# Patient Record
Sex: Female | Born: 1980 | Race: White | Hispanic: No | Marital: Single | Smoking: Never smoker
Health system: Southern US, Community
[De-identification: ages and names within clinical notes are randomized; demographics above are authoritative.]

## PROBLEM LIST (undated history)

## (undated) DIAGNOSIS — M359 Systemic involvement of connective tissue, unspecified: Secondary | ICD-10-CM

## (undated) DIAGNOSIS — F99 Mental disorder, not otherwise specified: Secondary | ICD-10-CM

## (undated) HISTORY — DX: Systemic involvement of connective tissue, unspecified: M35.9

## (undated) HISTORY — PX: OTHER SURGICAL HISTORY: SHX169

## (undated) HISTORY — DX: Mental disorder, not otherwise specified: F99

---

## 2015-10-07 ENCOUNTER — Encounter: Payer: Self-pay | Admitting: Family Medicine

## 2015-10-07 ENCOUNTER — Ambulatory Visit (INDEPENDENT_AMBULATORY_CARE_PROVIDER_SITE_OTHER): Payer: BC Managed Care – PPO | Admitting: Family Medicine

## 2015-10-07 ENCOUNTER — Ambulatory Visit (HOSPITAL_BASED_OUTPATIENT_CLINIC_OR_DEPARTMENT_OTHER)
Admission: RE | Admit: 2015-10-07 | Discharge: 2015-10-07 | Disposition: A | Discharge status: Home / Self Care | Payer: BC Managed Care – PPO | Source: Ambulatory Visit | Attending: Family Medicine | Admitting: Family Medicine

## 2015-10-07 VITALS — BP 117/71 | HR 75 | Ht 67.0 in | Wt 120.0 lb

## 2015-10-07 DIAGNOSIS — X58XXXA Exposure to other specified factors, initial encounter: Secondary | ICD-10-CM | POA: Diagnosis not present

## 2015-10-07 DIAGNOSIS — M25552 Pain in left hip: Secondary | ICD-10-CM

## 2015-10-07 DIAGNOSIS — M84359A Stress fracture, hip, unspecified, initial encounter for fracture: Secondary | ICD-10-CM | POA: Diagnosis not present

## 2015-10-07 DIAGNOSIS — M8430XA Stress fracture, unspecified site, initial encounter for fracture: Secondary | ICD-10-CM

## 2015-10-08 ENCOUNTER — Other Ambulatory Visit: Payer: Self-pay | Admitting: Family Medicine

## 2015-10-08 DIAGNOSIS — M25552 Pain in left hip: Secondary | ICD-10-CM

## 2015-10-10 ENCOUNTER — Telehealth: Payer: Self-pay | Admitting: Family Medicine

## 2015-10-10 DIAGNOSIS — M25552 Pain in left hip: Secondary | ICD-10-CM | POA: Insufficient documentation

## 2015-10-10 NOTE — Telephone Encounter (Signed)
The other way to go because her exam is consistent with a stress fracture is to not run for 6 weeks, reevaluate her in the office at that time.  It's not ideal because as we discussed in the office some of these stress fractures require her to not bear weight for several weeks but you don't know that for sure unless you have the MRI.  So I'd recommend she not run for 6 weeks, follow up with us at that time.

## 2015-10-10 NOTE — Assessment & Plan Note (Signed)
concerning for stress fracture.  Independently reviewed radiographs - negative for fracture or other bony abnormalities.  Will go ahead with MRI to assess for stress fracture.  No running in the meantime.

## 2015-10-10 NOTE — Progress Notes (Signed)
PCP: No primary care provider on file.  Subjective:   HPI: Patient is a 34 y.o. female here for left hip pain.  Patient reports she ran a half marathon 10 days ago. She has had some left hip pain dating back 4 weeks though it's been worse since the marathon. At most ran 27 miles per week leading up to the half. No acute injury or trauma. Pain worse with running. No history of stress fracture. Pain level 0/10 at rest but up to 7/10 with running, sharp anteriorly. No skin changes, fever, bruising, swelling.  No past medical history on file.  No current outpatient prescriptions on file prior to visit.   No current facility-administered medications on file prior to visit.    No past surgical history on file.  No Known Allergies  Social History   Social History  . Marital Status: Single    Spouse Name: N/A  . Number of Children: N/A  . Years of Education: N/A   Occupational History  . Not on file.   Social History Main Topics  . Smoking status: Never Smoker   . Smokeless tobacco: Not on file  . Alcohol Use: Not on file  . Drug Use: Not on file  . Sexual Activity: Not on file   Other Topics Concern  . Not on file   Social History Narrative  . No narrative on file    No family history on file.  BP 117/71 mmHg  Pulse 75  Ht 5\' 7"  (1.702 m)  Wt 120 lb (54.432 kg)  BMI 18.79 kg/m2  Review of Systems: See HPI above.    Objective:  Physical Exam:  Gen: NAD  Back: No gross deformity, scoliosis. No paraspinal TTP.  No midline or bony TTP. FROM without pain. Strength LEs 5/5 all muscle groups.   2+ MSRs in patellar and achilles tendons, equal bilaterally. Negative SLRs. Sensation intact to light touch bilaterally.  Left hip: FROM with negative logroll. Positive hop test. Negative fulcrum. Negative fabers, piriformis.    Assessment & Plan:  1. Left hip pain - concerning for stress fracture.  Independently reviewed radiographs - negative for fracture  or other bony abnormalities.  Will go ahead with MRI to assess for stress fracture.  No running in the meantime.

## 2015-10-12 ENCOUNTER — Ambulatory Visit (HOSPITAL_BASED_OUTPATIENT_CLINIC_OR_DEPARTMENT_OTHER): Payer: BC Managed Care – PPO

## 2015-10-24 ENCOUNTER — Other Ambulatory Visit (HOSPITAL_COMMUNITY): Payer: BC Managed Care – PPO

## 2015-11-18 ENCOUNTER — Encounter: Payer: Self-pay | Admitting: Family Medicine

## 2015-11-18 ENCOUNTER — Ambulatory Visit (INDEPENDENT_AMBULATORY_CARE_PROVIDER_SITE_OTHER): Payer: BC Managed Care – PPO | Admitting: Family Medicine

## 2015-11-18 VITALS — BP 109/78 | HR 73 | Ht 67.0 in | Wt 125.0 lb

## 2015-11-18 DIAGNOSIS — M25552 Pain in left hip: Secondary | ICD-10-CM

## 2015-11-18 NOTE — Patient Instructions (Addendum)
Follow up with me in 6 weeks at the latest. Don't run until I see you back. Call me in 2-4 weeks if you aren't improving and want to go ahead with an MRI. X-rays are another option - if you see healing it can be helpful.

## 2015-11-19 NOTE — Progress Notes (Signed)
PCP: Dwana Melena, MD  Subjective:   HPI: Patient is a 35 y.o. female here for left hip pain.  10/07/15: Patient reports she ran a half marathon 10 days ago. She has had some left hip pain dating back 4 weeks though it's been worse since the marathon. At most ran 27 miles per week leading up to the half. No acute injury or trauma. Pain worse with running. No history of stress fracture. Pain level 0/10 at rest but up to 7/10 with running, sharp anteriorly. No skin changes, fever, bruising, swelling.  11/18/15: Patient reports she has improved mildly since last visit. Pain level 0/10 at rest, up to 5/10 with a lot of walking. Used crutches for 4 weeks. No running or exercise for 6 weeks. No skin changes, fever, other complaints.  No past medical history on file.  Current Outpatient Prescriptions on File Prior to Visit  Medication Sig Dispense Refill  . busPIRone (BUSPAR) 15 MG tablet Take 15 mg by mouth 2 (two) times daily.  0  . traZODone (DESYREL) 50 MG tablet Take by mouth at bedtime as needed.  0   No current facility-administered medications on file prior to visit.    No past surgical history on file.  No Known Allergies  Social History   Social History  . Marital Status: Single    Spouse Name: N/A  . Number of Children: N/A  . Years of Education: N/A   Occupational History  . Not on file.   Social History Main Topics  . Smoking status: Never Smoker   . Smokeless tobacco: Not on file  . Alcohol Use: Not on file  . Drug Use: Not on file  . Sexual Activity: Not on file   Other Topics Concern  . Not on file   Social History Narrative    No family history on file.  BP 109/78 mmHg  Pulse 73  Ht  (1.702 m)  Wt 125 lb (56.7 kg)  BMI 19.57 kg/m2  Review of Systems: See HPI above.    Objective:  Physical Exam:  Gen: NAD  Back: No gross deformity, scoliosis. No paraspinal TTP.  No midline or bony TTP. FROM without pain. Strength LEs 5/5 all  muscle groups.   Negative SLRs. Sensation intact to light touch bilaterally.  Left hip: FROM with negative logroll. Mild tenderness anteriorly over hip joint.  No femur, other tenderness. Deferred hop test. Negative fulcrum.    Assessment & Plan:  1. Left hip pain - concerning for stress fracture.  Patient declined MRI due to cost instead opting for conservative approach - s/p 4 weeks of non weight bearing.  Has improved though only mildly.  We discussed doing MRI, radiographs to assess for possible visualization of a fracture or callus, or continued conservative measures with no running especially.  She is aware of risks without imaging of nonunion, possible surgery, pain.  She opted for another 2-4 weeks of conservative treatment and will call us to consider x-rays or MRI if not improving.  F/u in 6 weeks at latest.

## 2015-11-19 NOTE — Assessment & Plan Note (Signed)
concerning for stress fracture.  Patient declined MRI due to cost instead opting for conservative approach - s/p 4 weeks of non weight bearing.  Has improved though only mildly.  We discussed doing MRI, radiographs to assess for possible visualization of a fracture or callus, or continued conservative measures with no running especially.  She is aware of risks without imaging of nonunion, possible surgery, pain.  She opted for another 2-4 weeks of conservative treatment and will call us to consider x-rays or MRI if not improving.  F/u in 6 weeks at latest.

## 2015-12-30 ENCOUNTER — Ambulatory Visit (INDEPENDENT_AMBULATORY_CARE_PROVIDER_SITE_OTHER): Payer: BC Managed Care – PPO | Admitting: Family Medicine

## 2015-12-30 ENCOUNTER — Encounter: Payer: Self-pay | Admitting: Family Medicine

## 2015-12-30 VITALS — BP 107/72 | HR 68 | Ht 67.0 in | Wt 125.0 lb

## 2015-12-30 DIAGNOSIS — M25552 Pain in left hip: Secondary | ICD-10-CM | POA: Diagnosis not present

## 2015-12-30 NOTE — Patient Instructions (Signed)
Start 1:1 jog: walk for total of 10 minutes. Increase this every other day. So Wednesday you would do 2:1 JYN:WGNFjog:walk for 15 minutes;  Friday 3:1 AOZ:HYQMjog:walk for 20 minutes, etc. Call me if you have any problems. When you get to 9:1 you can eliminate the walk portion and just jog for 20-30 minutes straight. Calcium 1300mg  daily, Vitamin D 800 IU daily.

## 2016-01-01 NOTE — Assessment & Plan Note (Signed)
2/2 stress fracture.  Patient declined MRI due to cost instead opting for conservative approach.  Significant improvement, no pain now after 12 weeks.  Reviewed return to running program (see instructions).  Calcium, vitamin D.  Call us with any concerns, f/u prn.

## 2016-01-01 NOTE — Progress Notes (Addendum)
PCP: Dwana MelenaZack Hall, MD  Subjective:   HPI: Patient is a 35 y.o. female here for left hip pain.  10/07/15: Patient reports she ran a half marathon 10 days ago. She has had some left hip pain dating back 4 weeks though it's been worse since the marathon. At most ran 27 miles per week leading up to the half. No acute injury or trauma. Pain worse with running. No history of stress fracture. Pain level 0/10 at rest but up to 7/10 with running, sharp anteriorly. No skin changes, fever, bruising, swelling.  11/18/15: Patient reports she has improved mildly since last visit. Pain level 0/10 at rest, up to 5/10 with a lot of walking. Used crutches for 4 weeks. No running or exercise for 6 weeks. No skin changes, fever, other complaints.  3/6: Patient reports she feels significantly better. Pain level 0/10 now. Hasn't had any pain for at least 2-3 weeks. No pain with brisk walking for 25 minutes. No skin changes, fever.  No past medical history on file.  Current Outpatient Prescriptions on File Prior to Visit  Medication Sig Dispense Refill  . busPIRone (BUSPAR) 15 MG tablet Take 15 mg by mouth 2 (two) times daily.  0  . traZODone (DESYREL) 50 MG tablet Take by mouth at bedtime as needed.  0   No current facility-administered medications on file prior to visit.    No past surgical history on file.  No Known Allergies  Social History   Social History  . Marital Status: Single    Spouse Name: N/A  . Number of Children: N/A  . Years of Education: N/A   Occupational History  . Not on file.   Social History Main Topics  . Smoking status: Never Smoker   . Smokeless tobacco: Not on file  . Alcohol Use: Not on file  . Drug Use: Not on file  . Sexual Activity: Not on file   Other Topics Concern  . Not on file   Social History Narrative    No family history on file.  BP 107/72 mmHg  Pulse 68  Ht 5\' 7"  (1.702 m)  Wt 125 lb (56.7 kg)  BMI 19.57 kg/m2  Review of  Systems: See HPI above.    Objective:  Physical Exam:  Gen: NAD  Left hip: FROM with negative logroll. No tenderness anteriorly over hip joint.  No femur, other tenderness. Negative hop test. Negative fulcrum.    Assessment & Plan:  1. Left hip pain - 2/2 stress fracture.  Patient declined MRI due to cost instead opting for conservative approach.  Significant improvement, no pain now after 12 weeks.  Reviewed return to running program (see instructions).  Calcium, vitamin D.  Call us with any concerns, f/u prn.  Addendum:  MRI reviewed and discussed with patient.  Overall appears normal without evidence stress fracture, labral tear, other abnormalities.  Reassured patient.  She is going to come in Friday to repeat her exam, watch her gait for abnormalities.

## 2016-02-17 ENCOUNTER — Telehealth: Payer: Self-pay | Admitting: Family Medicine

## 2016-02-17 NOTE — Telephone Encounter (Signed)
Spoke to patient and pain is same as before. Wants to do MRI. Will set up for patient.

## 2016-02-17 NOTE — Telephone Encounter (Signed)
It's ok to go ahead with this as long as she feels the pain is identical to what it was before.  If not, we should recheck her first.

## 2016-02-24 ENCOUNTER — Telehealth: Payer: Self-pay | Admitting: Family Medicine

## 2016-02-24 NOTE — Telephone Encounter (Signed)
Left message for patient to call back  

## 2016-02-25 DIAGNOSIS — M25552 Pain in left hip: Secondary | ICD-10-CM

## 2016-02-25 NOTE — Addendum Note (Signed)
Addended by: Kathi SimpersWISE, Codee Bloodworth F on: 02/25/2016 02:26 PM   Modules accepted: Orders

## 2016-03-09 ENCOUNTER — Telehealth: Payer: Self-pay | Admitting: Family Medicine

## 2016-03-09 NOTE — Telephone Encounter (Signed)
I have results and will contact her this afternoon.

## 2016-03-13 ENCOUNTER — Ambulatory Visit (INDEPENDENT_AMBULATORY_CARE_PROVIDER_SITE_OTHER): Payer: BC Managed Care – PPO | Admitting: Family Medicine

## 2016-03-13 ENCOUNTER — Encounter: Payer: Self-pay | Admitting: Family Medicine

## 2016-03-13 VITALS — BP 104/70 | HR 73 | Ht 67.0 in | Wt 125.0 lb

## 2016-03-13 DIAGNOSIS — M25552 Pain in left hip: Secondary | ICD-10-CM | POA: Diagnosis not present

## 2016-03-17 NOTE — Progress Notes (Signed)
PCP: Suzanne MelenaZack Hall, MD  Subjective:   HPI: Patient is a 35 y.o. female here for left hip pain.  10/07/15: Patient reports she ran a half marathon 10 days ago. She has had some left hip pain dating back 4 weeks though it's been worse since the marathon. At most ran 27 miles per week leading up to the half. No acute injury or trauma. Pain worse with running. No history of stress fracture. Pain level 0/10 at rest but up to 7/10 with running, sharp anteriorly. No skin changes, fever, bruising, swelling.  11/18/15: Patient reports she has improved mildly since last visit. Pain level 0/10 at rest, up to 5/10 with a lot of walking. Used crutches for 4 weeks. No running or exercise for 6 weeks. No skin changes, fever, other complaints.  3/6: Patient reports she feels significantly better. Pain level 0/10 now. Hasn't had any pain for at least 2-3 weeks. No pain with brisk walking for 25 minutes. No skin changes, fever.  5/19: Patient returns with continued groin pain on left. Pain level 1-2/10, achy. Worse when she tries to run, sitting a long time. Tried yoga without much benefit. More soreness at end of day. No skin changes, numbness. Pain can radiate into thigh.  No past medical history on file.  Current Outpatient Prescriptions on File Prior to Visit  Medication Sig Dispense Refill  . busPIRone (BUSPAR) 15 MG tablet Take 15 mg by mouth 2 (two) times daily.  0  . traZODone (DESYREL) 50 MG tablet Take by mouth at bedtime as needed.  0   No current facility-administered medications on file prior to visit.    No past surgical history on file.  No Known Allergies  Social History   Social History  . Marital Status: Single    Spouse Name: N/A  . Number of Children: N/A  . Years of Education: N/A   Occupational History  . Not on file.   Social History Main Topics  . Smoking status: Never Smoker   . Smokeless tobacco: Not on file  . Alcohol Use: Not on file  . Drug  Use: Not on file  . Sexual Activity: Not on file   Other Topics Concern  . Not on file   Social History Narrative    No family history on file.  BP 104/70 mmHg  Pulse 73  Ht 5\' 7"  (1.702 m)  Wt 125 lb (56.7 kg)  BMI 19.57 kg/m2  Review of Systems: See HPI above.    Objective:  Physical Exam:  Gen: NAD, comfortable in exam room.  Left hip: FROM with mild pain on full IR. No tenderness anteriorly over hip joint.  No femur, other tenderness. Negative fulcrum. Minimally positive logroll. Negative fabers and piriformis stretches. No clicking on full motion.    Assessment & Plan:  1. Left hip pain - initial diagnosis of stress fracture - completed conservative treatment without imaging and improved.  Pain recurred though when she started running - did MRI which was normal.  No evidence of labral tear, stress fracture, or impingement findings on the MRI.  We discussed she may however have a labral tear as standard MRI without arthrogram can miss some of these.  Her location of pain and exam would suggest this possibility but still would strongly encourage conservative treatment.  She will start physical therapy and home exercise program, reevaluate in 1 month to 6 weeks.  Consider intraarticular injection, MR arthrogram if not improving.

## 2016-03-17 NOTE — Assessment & Plan Note (Signed)
initial diagnosis of stress fracture - completed conservative treatment without imaging and improved.  Pain recurred though when she started running - did MRI which was normal.  No evidence of labral tear, stress fracture, or impingement findings on the MRI.  We discussed she may however have a labral tear as standard MRI without arthrogram can miss some of these.  Her location of pain and exam would suggest this possibility but still would strongly encourage conservative treatment.  She will start physical therapy and home exercise program, reevaluate in 1 month to 6 weeks.  Consider intraarticular injection, MR arthrogram if not improving.

## 2016-04-01 ENCOUNTER — Encounter (HOSPITAL_COMMUNITY): Payer: Self-pay | Admitting: Physical Therapy

## 2016-04-01 ENCOUNTER — Ambulatory Visit (HOSPITAL_COMMUNITY): Payer: BC Managed Care – PPO | Attending: Family Medicine | Admitting: Physical Therapy

## 2016-04-01 DIAGNOSIS — M25552 Pain in left hip: Secondary | ICD-10-CM | POA: Insufficient documentation

## 2016-04-01 DIAGNOSIS — R2689 Other abnormalities of gait and mobility: Secondary | ICD-10-CM | POA: Diagnosis present

## 2016-04-02 ENCOUNTER — Telehealth (HOSPITAL_COMMUNITY): Payer: Self-pay | Admitting: Physical Therapy

## 2016-04-02 NOTE — Telephone Encounter (Signed)
She has an insurance that will not cover an outpt therapy unless she meets her deductable first. Request to be D/C

## 2016-04-03 NOTE — Therapy (Signed)
[facility] [address] [place], [place], [address] Phone: [phone]   Fax:  [phone]  Physical Therapy Evaluation  Patient Details  Name: Suzanne Rogers MRN: [id number] Date of Birth: 1981-04-03 Referring Provider: Annie ParasShane Hundall, MD  Encounter Date: 04/01/2016      PT End of Session - 04/03/16 1010    Visit Number 1   Number of Visits 1   Authorization Type [employer]   Authorization Time Period 1 time visit   PT Start Time 1650   PT Stop Time 1734   PT Time Calculation (min) 44 min   Activity Tolerance Patient tolerated treatment well   Behavior During Therapy [facility] for tasks assessed/performed      History reviewed. No pertinent past medical history.  History reviewed. No pertinent past surgical history.  There were no vitals filed for this visit.           [facility] - 04/03/16 0001    Assessment   Medical Diagnosis Lt hip pain   Referring Provider Suzanne ParasShane Hundall, MD   Next MD Visit 4-6 weeks   Prior Therapy none   Precautions   Precautions --  no running    Restrictions   Weight Bearing Restrictions No   Balance Screen   Has the patient fallen in the past 6 months No   Has the patient had a decrease in activity level because of a fear of falling?  No   Is the patient reluctant to leave their home because of a fear of falling?  No   Home [employer] information [employer] managernvironment   Living Environment Private residence   Prior Function   Level of Independence Independent   Leisure running 25+ miles a week   Cognition   Overall Cognitive Status Within Functional Limits for tasks assessed   Observation/Other Assessments   Focus on Therapeutic Outcomes (FOTO)  35% limited   Observation/Other Assessments-Edema    Edema --  None noted   Sensation   Light Touch Appears Intact   Functional Tests   Functional tests Squat;Single Leg Squat   Squat   Comments (+) BLE valgus noted   Single Leg Squat   Comments (+) valgus (+) corkscrew  deviation noted BLE   Posture/Postural Control   Posture/Postural Control No significant limitations   Posture Comments anterior tilt, ASIS/PSIS appear equal, no LLD noted   Flexibility   Soft Tissue Assessment /Muscle Length yes  hamstring, quad WNL (+) thomas test for B hip flexor   Palpation   Palpation comment TTP along B illiopsoas (L>R), L hip adductor, Lt piriformis   Special Tests    Special Tests Hip Special Tests   Hip Special Tests  Luisa HartPatrick (FABER) Test;SI Compression;SI Distraction;Anterior Hip Impingement Test   Luisa Hartatrick [facility](FABER) Test   Findings Positive   Side Left   SI Compression   Findings Negative   SI Distraction   Findings  Negative   Anterior Hip Impingement Test    Findings Negative                   [facility] Adult PT Treatment/Exercise - 04/03/16 0001    Exercises   Exercises Knee/Hip;Other Exercises   Other Exercises  Half kneel hold x20 sec each, demo correct setup   Knee/Hip Exercises: Stretches   Other Knee/Hip Stretches seated hip adductor stretch with 1 UE external rotation against wall, x20 sec each  PT Education - 04/03/16 1009    Education provided Yes   Education Details eval findings/POC; HEP; importance of hip/knee stability during long distance running to decrease risk of over use injury   Person(s) Educated Patient   Methods Explanation;Demonstration;Handout   Comprehension Verbalized understanding;Returned demonstration          PT Short Term Goals - 04/03/16 1013    PT SHORT TERM GOAL #1   Title Pt to verbalize and demonstrate understanding of HEP   Time 1   Period Days   Status Achieved                  Plan - 04/03/16 1011    Clinical Impression Statement Pt is a 35yo F referred to OPPT with c/o long lasting Lt hip pain brought on after training for a marathon back in the fall of 2016. She was running ~25 miles a week and took several weeks off and found some relief, however once she  returned to running the pain resurfaced. Imaging was negative for fracture, labral pathology. She denies any back pain and presents with overall good strength, minor limitations in hip extensor strength. Examination reveals tenderness to palpation along her illiac crest, hip adductors and piriformis, limited hip flexor flexibility and significant deficits in hip stability evident by knee valgus deviation with double and single stance squatting. Therapist reviewed the importance of good control/mechanics with running in order to prevent over use injuries and provided HEP. Pt was able to return correct demonstration of therex and all questions were addressed. Pt would benefit from skilled PT to address her impairments, but at this time she is unable to cover her copay and is requesting to be discharged. PT will sign off.    Rehab Potential Good   PT Frequency One time visit   PT Next Visit Plan No follow up at this time   PT Home Exercise Plan handout and review of HEP: bridge, hip adductor stretch (seated), half kneel hold, squat with TB around knees to prevent valgus   Recommended Other Services None   Consulted and Agree with Plan of Care Patient      Patient will benefit from skilled therapeutic intervention in order to improve the following deficits and impairments:  Decreased activity tolerance, Decreased strength, Improper body mechanics, Increased muscle spasms, Pain  Visit Diagnosis: Pain in left hip  Other abnormalities of gait and mobility     Problem List Patient Active Problem List   Diagnosis Date Noted  . Left hip pain 10/10/2015    10:30 AM,04/03/2016 [name], [name] [phone]  [facility] [address] [place], [place], [address] Phone: [phone]   Fax:  [phone]  Name: Suzanne Rogers MRN: [id number] Date of Birth: 1981-09-20

## 2016-04-08 ENCOUNTER — Encounter (HOSPITAL_COMMUNITY): Payer: BC Managed Care – PPO | Admitting: Physical Therapy

## 2016-04-14 ENCOUNTER — Encounter (HOSPITAL_COMMUNITY): Payer: BC Managed Care – PPO | Admitting: Physical Therapy

## 2016-04-21 ENCOUNTER — Encounter (HOSPITAL_COMMUNITY): Payer: BC Managed Care – PPO | Admitting: Physical Therapy

## 2016-04-22 ENCOUNTER — Encounter (HOSPITAL_COMMUNITY): Payer: BC Managed Care – PPO

## 2016-04-30 ENCOUNTER — Encounter (HOSPITAL_COMMUNITY): Payer: BC Managed Care – PPO | Admitting: Physical Therapy

## 2016-05-07 ENCOUNTER — Encounter (HOSPITAL_COMMUNITY): Payer: BC Managed Care – PPO | Admitting: Physical Therapy

## 2016-12-08 ENCOUNTER — Encounter: Payer: Self-pay | Admitting: Adult Health

## 2016-12-08 ENCOUNTER — Other Ambulatory Visit (HOSPITAL_COMMUNITY)
Admission: RE | Admit: 2016-12-08 | Discharge: 2016-12-08 | Disposition: A | Discharge status: Home / Self Care | Payer: BC Managed Care – PPO | Source: Ambulatory Visit | Attending: Adult Health | Admitting: Adult Health

## 2016-12-08 ENCOUNTER — Ambulatory Visit (INDEPENDENT_AMBULATORY_CARE_PROVIDER_SITE_OTHER): Payer: BC Managed Care – PPO | Admitting: Adult Health

## 2016-12-08 VITALS — BP 112/66 | HR 55 | Ht 67.0 in | Wt 132.0 lb

## 2016-12-08 DIAGNOSIS — R5383 Other fatigue: Secondary | ICD-10-CM

## 2016-12-08 DIAGNOSIS — E049 Nontoxic goiter, unspecified: Secondary | ICD-10-CM

## 2016-12-08 DIAGNOSIS — Z01419 Encounter for gynecological examination (general) (routine) without abnormal findings: Secondary | ICD-10-CM | POA: Insufficient documentation

## 2016-12-08 DIAGNOSIS — F329 Major depressive disorder, single episode, unspecified: Secondary | ICD-10-CM

## 2016-12-08 DIAGNOSIS — Z1151 Encounter for screening for human papillomavirus (HPV): Secondary | ICD-10-CM | POA: Insufficient documentation

## 2016-12-08 DIAGNOSIS — Z01411 Encounter for gynecological examination (general) (routine) with abnormal findings: Secondary | ICD-10-CM | POA: Diagnosis not present

## 2016-12-08 DIAGNOSIS — F32A Depression, unspecified: Secondary | ICD-10-CM

## 2016-12-08 MED ORDER — NORETHIN-ETH ESTRAD-FE BIPHAS 1 MG-10 MCG / 10 MCG PO TABS: 1.0000 | ORAL_TABLET | Freq: Every day | ORAL | 4 refills | Status: DC

## 2016-12-08 NOTE — Progress Notes (Signed)
Patient ID: Suzanne RodesJennifer Rogers, female   DOB: 09-29-1981, 36 y.o.   MRN: [id number] History of Present Illness: Suzanne Rogers is a 36 year old white female in for a well woman gyn exam and pap.She is a runner and works at [employer].[place] went [place] [employer]. PCP is Dr Margo AyeHall.   Current Medications, Allergies, Past Medical History, Past Surgical History, Family History and Social History were reviewed in [facility] electronic [facility].     Review of Systems: Patient denies any headaches, hearing loss,  blurred vision, shortness of breath, chest pain, abdominal pain, problems with bowel movements, urination, or intercourse(not ever had sex). No joint pain or mood swings. +tired(sister has low ferritin levels) and + depression esp around period, and some breast tenderness, periods regular, she took OCs in college.    Physical Exam:BP 112/66 (BP Location: Left Arm, Patient Position: Sitting, Cuff Size: Normal)   Pulse (!) 55   Ht 5\' 7"  (1.702 m)   Wt 132 lb (59.9 kg)   LMP 12/01/2016 (Exact Date)   BMI 20.67 kg/m  General:  Well developed, well nourished, no acute distress Skin:  Warm and dry Neck:  Midline trachea, enlarged thyroid, good ROM, no lymphadenopathy Lungs; Clear to auscultation bilaterally Breast:  No dominant palpable mass, retraction, or nipple discharge Cardiovascular: Regular rate and rhythm Abdomen:  Soft, non tender, no hepatosplenomegaly Pelvic:  External genitalia is normal in appearance, no lesions.  The vagina is normal in appearance. Urethra has no lesions or masses. The cervix is nulliparous and everted at os, pap with HPV performed.  Uterus is felt to be normal size, shape, and contour.  No adnexal masses or tenderness noted.Bladder is non tender, no masses felt. Extremities/musculoskeletal:  No swelling or varicosities noted, no clubbing or cyanosis Psych:  No mood changes, alert and cooperative,seems happy PHQ 9 score 9, sees counselor, declines meds,and denies  any suicidal or homicidal ideations today, will try OCs since worse with period She declines thyroid US.   Impression: 1. Encounter for gynecological examination with Papanicolaou smear of cervix   2. Depression, unspecified depression type   3. Enlarged thyroid   4. Tired       Plan: Check TSH and free T4 and ferritin Start lo loestrin today,3 packs given lot [id number] A, exp 8/19 Rx lo loestrin disp 3 packs take 1 daily with 4 refills Follow up in 3 months Physical in 1 year Pap in 3 if normal

## 2016-12-09 LAB — FERRITIN: FERRITIN: 15 ng/mL (ref 15–150)

## 2016-12-09 LAB — T4, FREE: Free T4: 1.13 ng/dL (ref 0.82–1.77)

## 2016-12-09 LAB — TSH: TSH: 2.04 u[IU]/mL (ref 0.450–4.500)

## 2016-12-10 LAB — CYTOLOGY - PAP
DIAGNOSIS: NEGATIVE
HPV (WINDOPATH): NOT DETECTED

## 2017-01-12 ENCOUNTER — Telehealth: Payer: Self-pay | Admitting: Adult Health

## 2017-01-12 NOTE — Telephone Encounter (Signed)
Pt wants to speak to someone about changing a medication that was prescribed by Victorino DikeJennifer.

## 2017-01-12 NOTE — Telephone Encounter (Signed)
Patient called stating she has been seeing her counselor for depression and anxiety but it seems to be worse. Her counselor suggested a low dose Xanax. Please advise.

## 2017-01-13 MED ORDER — ALPRAZOLAM 0.25 MG PO TABS: 0.2500 mg | ORAL_TABLET | Freq: Two times a day (BID) | ORAL | 0 refills | Status: DC | PRN

## 2017-01-13 NOTE — Telephone Encounter (Signed)
Left message that I sent xanax Rx to try, let me know how it does

## 2017-02-08 ENCOUNTER — Other Ambulatory Visit: Payer: Self-pay | Admitting: Adult Health

## 2017-02-08 MED ORDER — ALPRAZOLAM 0.25 MG PO TABS: 0.2500 mg | ORAL_TABLET | Freq: Two times a day (BID) | ORAL | 0 refills | Status: DC | PRN

## 2017-02-08 NOTE — Telephone Encounter (Signed)
Refilled xanax

## 2017-02-11 ENCOUNTER — Other Ambulatory Visit: Payer: Self-pay | Admitting: Adult Health

## 2017-02-12 ENCOUNTER — Other Ambulatory Visit: Payer: Self-pay | Admitting: Adult Health

## 2017-03-08 ENCOUNTER — Ambulatory Visit: Payer: BC Managed Care – PPO | Admitting: Adult Health

## 2017-03-10 ENCOUNTER — Other Ambulatory Visit: Payer: Self-pay | Admitting: Adult Health

## 2017-05-18 ENCOUNTER — Other Ambulatory Visit: Payer: Self-pay | Admitting: Adult Health

## 2017-07-30 ENCOUNTER — Other Ambulatory Visit: Payer: Self-pay | Admitting: Adult Health

## 2017-08-03 ENCOUNTER — Telehealth: Payer: Self-pay | Admitting: *Deleted

## 2017-08-03 NOTE — Telephone Encounter (Signed)
Spoke with pt. Pt is requesting a refill on Xanax but hasn't been seen since Feb.,2018. JAG advised she needs to be seen. Pt voiced understanding. Pt states she will go to PCP because it's cheaper to be seen there than here. JSY

## 2017-12-14 ENCOUNTER — Ambulatory Visit (INDEPENDENT_AMBULATORY_CARE_PROVIDER_SITE_OTHER): Payer: BC Managed Care – PPO | Admitting: Adult Health

## 2017-12-14 ENCOUNTER — Encounter: Payer: Self-pay | Admitting: Adult Health

## 2017-12-14 VITALS — BP 98/50 | HR 85 | Ht 67.0 in | Wt 119.0 lb

## 2017-12-14 DIAGNOSIS — E049 Nontoxic goiter, unspecified: Secondary | ICD-10-CM

## 2017-12-14 DIAGNOSIS — Z01411 Encounter for gynecological examination (general) (routine) with abnormal findings: Secondary | ICD-10-CM | POA: Diagnosis not present

## 2017-12-14 DIAGNOSIS — Z01419 Encounter for gynecological examination (general) (routine) without abnormal findings: Secondary | ICD-10-CM

## 2017-12-14 NOTE — Progress Notes (Signed)
Patient ID: Suzanne Rogers, female   DOB: 1980/12/22, 37 y.o.   MRN: [id number] History of Present Illness: Suzanne Rogers is a 37 year old white female, single, in for well woman gyn exam,she had normal pap with negative HPV 12/08/16.She works at [employer] and is a runner.  PCP is [facility].    Current Medications, Allergies, Past Medical History, Past Surgical History, Family History and Social History were reviewed in [facility] electronic medical record.     Review of Systems: Patient denies any headaches, hearing loss, fatigue, blurred vision, shortness of breath, chest pain, abdominal pain, problems with bowel movements, urination, or intercourse(not having sex). No joint pain or mood swings. She did ask about seeing dietician and can refer when she wants.   Physical Exam:BP (!) 98/50 (BP Location: Right Arm, Patient Position: Sitting, Cuff Size: Normal)   Pulse 85   Ht 5\' 7"  (1.702 m)   Wt 119 lb (54 kg)   LMP 11/20/2017 (Exact Date)   BMI 18.64 kg/m  General:  Well developed, well nourished, no acute distress Skin:  Warm and dry Neck:  Midline trachea, enlarged thyroid,has small palpable cervical lymph node on right, good ROM Lungs; Clear to auscultation bilaterally Breast:  No dominant palpable mass, retraction, or nipple discharge Cardiovascular: Regular rate and rhythm Abdomen:  Soft, non tender, no hepatosplenomegaly Pelvic:  External genitalia is normal in appearance, no lesions.  The vagina is normal in appearance. Urethra has no lesions or masses. The cervix is smooth.  Uterus is felt to be normal size, shape, and contour.  No adnexal masses or tenderness noted.Bladder is non tender, no masses felt. Extremities/musculoskeletal:  No swelling or varicosities noted, no clubbing or cyanosis Psych:  No mood changes, alert and cooperative,seems happy PHQ 9 score 3, is on xanax prn anxiety.Mom recently diagnosed with Alzheimer's.   Impression: 1. Encounter for well woman exam with  routine gynecological exam   2. Enlarged thyroid       Plan[facility] Pap in 2021 Labs per PCP Mammogram at 40

## 2017-12-16 ENCOUNTER — Ambulatory Visit (HOSPITAL_COMMUNITY)
Admission: RE | Admit: 2017-12-16 | Discharge: 2017-12-16 | Disposition: A | Discharge status: Home / Self Care | Payer: BC Managed Care – PPO | Source: Ambulatory Visit | Attending: Adult Health | Admitting: Adult Health

## 2017-12-16 DIAGNOSIS — E049 Nontoxic goiter, unspecified: Secondary | ICD-10-CM | POA: Insufficient documentation

## 2017-12-17 ENCOUNTER — Telehealth: Payer: Self-pay | Admitting: Adult Health

## 2017-12-17 NOTE — Telephone Encounter (Signed)
Pt called requesting results from her thyroid u/s. DOB verified. Informed pt that u/s was normal. Pt verbalized understanding.

## 2021-02-26 ENCOUNTER — Encounter: Payer: Self-pay | Admitting: Adult Health

## 2021-02-26 ENCOUNTER — Other Ambulatory Visit: Payer: Self-pay

## 2021-02-26 ENCOUNTER — Ambulatory Visit (INDEPENDENT_AMBULATORY_CARE_PROVIDER_SITE_OTHER): Payer: BC Managed Care – PPO | Admitting: Adult Health

## 2021-02-26 ENCOUNTER — Other Ambulatory Visit (HOSPITAL_COMMUNITY)
Admission: RE | Admit: 2021-02-26 | Discharge: 2021-02-26 | Disposition: A | Discharge status: Home / Self Care | Payer: BC Managed Care – PPO | Source: Ambulatory Visit | Attending: Adult Health | Admitting: Adult Health

## 2021-02-26 VITALS — BP 113/72 | HR 86 | Ht 67.25 in | Wt 116.5 lb

## 2021-02-26 DIAGNOSIS — Z01419 Encounter for gynecological examination (general) (routine) without abnormal findings: Secondary | ICD-10-CM | POA: Insufficient documentation

## 2021-02-26 DIAGNOSIS — F32A Depression, unspecified: Secondary | ICD-10-CM | POA: Insufficient documentation

## 2021-02-26 DIAGNOSIS — Z1231 Encounter for screening mammogram for malignant neoplasm of breast: Secondary | ICD-10-CM | POA: Diagnosis not present

## 2021-02-26 DIAGNOSIS — F419 Anxiety disorder, unspecified: Secondary | ICD-10-CM | POA: Insufficient documentation

## 2021-02-26 NOTE — Progress Notes (Signed)
Patient ID: Suzanne Rogers, female   DOB: 08/17/1981, 40 y.o.   MRN: [id number] History of Present Illness: Suzanne Rogers is a 40 year old white female,single, G0P0, in for a well woman gyn exam and pap.  She works at [employer] PCP is Dr Margo Aye   Current Medications, Allergies, Past Medical History, Past Surgical History, Family History and Social History were reviewed in [facility] record.     Review of Systems:  Patient denies any headaches, hearing loss, fatigue, blurred vision, shortness of breath, chest pain, abdominal pain, problems with bowel movements, urination, or intercourse(never had sex). No joint pain or mood swings. Periods shorter and may have breast tenderness at that time   Physical Exam:BP 113/72 (BP Location: Left Arm, Patient Position: Sitting, Cuff Size: Normal)   Pulse 86   Ht 5' 7.25" (1.708 m)   Wt 116 lb 8 oz (52.8 kg)   LMP 02/22/2021   BMI 18.11 kg/m  General:  Well developed, well nourished, no acute distress Skin:  Warm and dry Neck:  Midline trachea, normal thyroid, good ROM, no lymphadenopathy Lungs; Clear to auscultation bilaterally Breast:  No dominant palpable mass, retraction, or nipple discharge Cardiovascular: Regular rate and rhythm Abdomen:  Soft, non tender, no hepatosplenomegaly Pelvic:  External genitalia is normal in appearance, no lesions.  The vagina is normal in appearance,+priod blood. Urethra has no lesions or masses. The cervix is nulliparous, pap with HR HPV genotyping performed.  Uterus is felt to be normal size, shape, and contour.  No adnexal masses or tenderness noted.Bladder is non tender, no masses felt. Extremities/musculoskeletal:  No swelling or varicosities noted, no clubbing or cyanosis Psych:  No mood changes, alert and cooperative,seems happy AA is 1 Fall risk is low PHQ 9 score is 9 GAD 7 score is 14, sees therapist   Impression and Plan: 1. Encounter for gynecological examination with Papanicolaou smear of  cervix Pap sent Physical in 1 year Pap in 3 if normal Labs with PCP or Dr Artis Flock.  2. Screening mammogram for breast cancer Pt to [facility] [place]  3. Anxiety and depression Seeing a therapist and has had Transcranial Magnetic Stimulation

## 2021-02-28 LAB — CYTOLOGY - PAP
Comment: NEGATIVE
Diagnosis: NEGATIVE
High risk HPV: NEGATIVE

## 2021-04-07 ENCOUNTER — Other Ambulatory Visit: Payer: Self-pay | Admitting: Adult Health

## 2021-04-07 DIAGNOSIS — Z1231 Encounter for screening mammogram for malignant neoplasm of breast: Secondary | ICD-10-CM

## 2021-04-09 ENCOUNTER — Ambulatory Visit
Admission: RE | Admit: 2021-04-09 | Discharge: 2021-04-09 | Disposition: A | Discharge status: Home / Self Care | Payer: BC Managed Care – PPO | Source: Ambulatory Visit

## 2021-04-09 ENCOUNTER — Other Ambulatory Visit: Payer: Self-pay

## 2021-04-09 DIAGNOSIS — Z1231 Encounter for screening mammogram for malignant neoplasm of breast: Secondary | ICD-10-CM

## 2021-05-22 ENCOUNTER — Ambulatory Visit (INDEPENDENT_AMBULATORY_CARE_PROVIDER_SITE_OTHER): Payer: BC Managed Care – PPO | Admitting: Behavioral Health

## 2021-05-22 ENCOUNTER — Encounter: Payer: Self-pay | Admitting: Behavioral Health

## 2021-05-22 ENCOUNTER — Other Ambulatory Visit: Payer: Self-pay

## 2021-05-22 VITALS — BP 97/64 | HR 81 | Ht 67.0 in | Wt 120.0 lb

## 2021-05-22 DIAGNOSIS — F411 Generalized anxiety disorder: Secondary | ICD-10-CM

## 2021-05-22 DIAGNOSIS — F331 Major depressive disorder, recurrent, moderate: Secondary | ICD-10-CM | POA: Diagnosis not present

## 2021-05-22 MED ORDER — VILAZODONE HCL 20 MG PO TABS: 20.0000 mg | ORAL_TABLET | Freq: Every day | ORAL | 1 refills | Status: DC

## 2021-05-22 NOTE — Progress Notes (Signed)
[facility] MD/PA/NP Initial Note  05/22/2021 5:37 PM Suzanne Rogers  MRN:  [id number]  Chief Complaint:  Chief Complaint   Anxiety; Depression; Establish Care; Medication Problem     HPI: 40 year old female presents to this office for initial visit and to establish care. She is prior patient of Suzanne Rogers, [place], this office. Referred this visit by her therapist [name]. She has previous diagnosis by therapist of [facility]. She says that she has struggled with anxiety and depression for 20 years. Says that so far she has had unsuccessful experiences with SSRI's that have left her frustrated to find help for her depression. She recently had GeneSight testing completed because she thought this might help reveal some clues. She previously has experienced side effects like terrible headaches and suicidal ideation. She does not want to try any new medication until she has safety net of friends she can call if negative effects occur. She was grossly negative on mood disorder questionnaire. Says she has had some insecurities in social settings. Says she has not dated since college. Would like to, but not interested in participating in modern vehicles like Internet dating. Strongly values Christian faith when considering mate.  She is active with physical well being and runs often. She is reporting her anxiety level today at 6/10 and depression at 5/10. Sleeps 7-8 hours per night. She is agreeable to trying one more SSRI that was recommended by GeneSight and then revaluate. She denies mania and endorses long hx of depression. No psychosis, no auditory or visual hallucinations. Endorses some hx of SI but does not have a plan. Strong Christian values is factor.   Previous psychiatric medication trials: Desvenlaxafine Buproprion Duloxetine Paroxetine Xanax Buspar Klonopin         Visit Diagnosis:    ICD-10-CM   1. Generalized anxiety disorder  F41.1 Vilazodone HCl 20 MG TABS    2. Major depressive  disorder, recurrent episode, moderate (HCC)  F33.1 Vilazodone HCl 20 MG TABS      Past Psychiatric History: see chart  Past Medical History:  Past Medical History:  Diagnosis Date   Connective tissue disorder ([place])    Mental disorder    anx/dep    Past Surgical History:  Procedure Laterality Date   left knee arthroscopy      Family Psychiatric History: none noted  Family History:  Family History  Problem Relation Age of Onset   Dementia Mother    Alzheimer's disease Mother    Anxiety disorder Father    Depression Father    Heart attack Father    Anxiety disorder Sister    Depression Sister    58 / Stillbirths Sister    Endometriosis Sister    Depression Paternal Uncle    Anxiety disorder Paternal Uncle    Heart attack Maternal Grandfather    Diabetes Paternal Grandfather    Cancer Paternal Grandmother        breast    Social History:  Social History   Socioeconomic History   Marital status: Single    Spouse name: Not on file   Number of children: Not on file   Years of education: 18   Highest education level: Master's degree (e.g., MA, MS, MEng, MEd, MSW, MBA)  Occupational History   Occupation: [employer]  Tobacco Use   Smoking status: Never   Smokeless tobacco: Never  Vaping Use   Vaping Use: Never used  Substance and Sexual Activity   Alcohol use: Yes  Alcohol/week: 0.0 standard drinks    Comment: rarely   Drug use: No   Sexual activity: Never    Birth control/protection: None  Other Topics Concern   Not on file  Social History Narrative   Lives alone  in [place].  Likes to run. Likes to read and learn.    Social Determinants of Health   Financial Resource Strain: Low Risk    Difficulty of Paying Living Expenses: Not hard at all  Food Insecurity: No Food Insecurity   Worried About Charity fundraiser in the Last Year: Never true   [place] in the Last Year: Never true  Transportation Needs: No  Transportation Needs   Lack of Transportation (Medical): No   Lack of Transportation (Non-Medical): No  Physical Activity: Sufficiently Active   Days of Exercise per Week: 7 days   Minutes of Exercise per Session: 30 min  Stress: Stress Concern Present   Feeling of Stress : Rather much  Social Connections: Moderately Integrated   Frequency of Communication with Friends and Family: Once a week   Frequency of Social Gatherings with Friends and Family: Twice a week   Attends Religious Services: More than 4 times per year   Active Member of [employer] or Organizations: Yes   Attends Archivist Meetings: 1 to 4 times per year   Marital Status: Never married    Allergies: No Known Allergies  Metabolic Disorder Labs: No results found for: HGBA1C, MPG No results found for: PROLACTIN No results found for: CHOL, TRIG, HDL, CHOLHDL, VLDL, LDLCALC Lab Results  Component Value Date   TSH 2.040 12/08/2016    Therapeutic Level Labs: No results found for: LITHIUM No results found for: VALPROATE No components found for:  CBMZ  Current Medications: Current Outpatient Medications  Medication Sig Dispense Refill   ALPRAZolam (XANAX) 0.25 MG tablet TAKE 1 TABLET BY MOUTH TWICE A DAY AS NEEDED FOR ANXIETY 30 tablet 1   hydroxychloroquine (PLAQUENIL) 200 MG tablet Take by mouth.     meloxicam (MOBIC) 15 MG tablet Take 1 tablet by mouth daily.     Multiple Vitamin (MULTIVITAMIN) tablet Take 1 tablet by mouth daily.     Vilazodone HCl 20 MG TABS Take 1 tablet (20 mg total) by mouth daily after breakfast. 30 tablet 1   No current facility-administered medications for this visit.    Medication Side Effects: none  Orders placed this visit:  No orders of the defined types were placed in this encounter.   Psychiatric Specialty Exam:  Review of Systems  Constitutional: Negative.   Cardiovascular:  Negative for palpitations.  Allergic/Immunologic: Negative.   Neurological:  Negative for  tremors and weakness.   Blood pressure 97/64, pulse 81, height 5' 7"  (1.702 m), weight 120 lb (54.4 kg).Body mass index is 18.79 kg/m.  General Appearance: Casual, Neat, and Well Groomed  Eye Contact:  Good  Speech:  Clear and Coherent  Volume:  Normal  Mood:  Anxious and Depressed  Affect:  Appropriate  Thought Process:  Coherent  Orientation:  Full (Time, Place, and Person)  Thought Content: Logical   Suicidal Thoughts:  No  Homicidal Thoughts:  No  Memory:  WNL  Judgement:  Good  Insight:  Good  Psychomotor Activity:  Normal  Concentration:  Concentration: Good  Recall:  Good  Fund of Knowledge: Good  Language: Good  Assets:  Desire for Improvement  ADL's:  Intact  Cognition: WNL  Prognosis:  Good  Screenings:  GAD-7    Flowsheet Row Office Visit from 02/26/2021 in [facility]  Total GAD-7 Score 14      PHQ2-9    [place] Visit from 02/26/2021 in [facility] Office Visit from 12/14/2017 in [facility] Office Visit from 12/08/2016 in [facility]  PHQ-2 Total Score 3 2 4   PHQ-9 Total Score 9 3 9        Receiving Psychotherapy: Yes   Treatment Plan/Recommendations:  To start Viibryd 10 mg for 7 days, then 20 mg tablet daily. Continue Xanax 0.25  twice daily as needed Starter pack sample kit provided #7 10 mg tablets, #7 20 mg tablets To report side effects or worsening symptoms Emergency contact information provided Greater than 50% of 60 min face to face time with patient was spent on counseling and coordination of care. We discussed long history of anxiety and depression. Also discussed GeneSight testing and  multiple medication failures. Reviewed barriers to medications and fears. Agreed that safety net appropriate for starting medication. She will not be able to start medication until next week and then will follow up 4 weeks thereafter. Patient is not sexually active and not on BC.    Elwanda [place], NP

## 2021-06-26 ENCOUNTER — Telehealth (INDEPENDENT_AMBULATORY_CARE_PROVIDER_SITE_OTHER): Payer: BC Managed Care – PPO | Admitting: Behavioral Health

## 2021-06-26 ENCOUNTER — Telehealth: Payer: Self-pay | Admitting: Behavioral Health

## 2021-06-26 ENCOUNTER — Encounter: Payer: Self-pay | Admitting: Behavioral Health

## 2021-06-26 DIAGNOSIS — F331 Major depressive disorder, recurrent, moderate: Secondary | ICD-10-CM

## 2021-06-26 DIAGNOSIS — F411 Generalized anxiety disorder: Secondary | ICD-10-CM

## 2021-06-26 MED ORDER — VILAZODONE HCL 10 MG PO TABS: 10.0000 mg | ORAL_TABLET | Freq: Every day | ORAL | 1 refills | Status: DC

## 2021-06-26 NOTE — Telephone Encounter (Signed)
Pt called in after appt to say that [facility] discussed adding Trazedone to her medications. She would like to know if they will be filled. Ph: [phone]

## 2021-06-26 NOTE — Progress Notes (Signed)
[facility] Med Check  Patient ID: Suzanne Rogers,  MRN: [id number]  PCP: Benita Stabile, MD  Date of Evaluation: 06/26/2021 Time spent:20 minutes  Chief Complaint:   HISTORY/CURRENT STATUS: HPI  40 year old female presents to this office for follow up and medication management. She says that her anxiety and depression have improved overall but she does feel flat. Says this is not entirely bad thing considering. She does say that she has had upset stomach daily for nearly 5 weeks except for a couple days here and there. She does not know how much more she can endure of this. She agrees to titrate Viibryd back down to 10 mg to see if this will help.  She says her anxiety today is 3/10 and depression is 3/10. She reports broken sleep at night and would like to consider medication to help improve quality. She denies mania, no psychosis, no SI/HI.   Previous psychiatric medication trials: Desvenlaxafine Buproprion Duloxetine Paroxetine Xanax Buspar Klonopin        Individual Medical History/ Review of Systems: Changes? :No   Allergies: Patient has no known allergies.  Current Medications:  Current Outpatient Medications:    Vilazodone HCl (VIIBRYD) 10 MG TABS, Take 1 tablet (10 mg total) by mouth daily., Disp: 30 tablet, Rfl: 1   ALPRAZolam (XANAX) 0.25 MG tablet, TAKE 1 TABLET BY MOUTH TWICE A DAY AS NEEDED FOR ANXIETY, Disp: 30 tablet, Rfl: 1   hydroxychloroquine (PLAQUENIL) 200 MG tablet, Take by mouth., Disp: , Rfl:    meloxicam (MOBIC) 15 MG tablet, Take 1 tablet by mouth daily., Disp: , Rfl:    Multiple Vitamin (MULTIVITAMIN) tablet, Take 1 tablet by mouth daily., Disp: , Rfl:    Vilazodone HCl 20 MG TABS, Take 1 tablet (20 mg total) by mouth daily after breakfast., Disp: 30 tablet, Rfl: 1 Medication Side Effects: anxiety  Family Medical/ Social History: Changes? No  MENTAL HEALTH EXAM:  There were no vitals taken for this visit.There is no height or weight on file to  calculate BMI.  General Appearance: Casual, Neat, and Well Groomed  Eye Contact:  Good  Speech:  Clear and Coherent  Volume:  Normal  Mood:  Anxious and Depressed  Affect:  Blunt and Flat  Thought Process:  Coherent  Orientation:  Full (Time, Place, and Person)  Thought Content: Logical   Suicidal Thoughts:  No  Homicidal Thoughts:  No  Memory:  WNL  Judgement:  Good  Insight:  Good  Psychomotor Activity:  Normal  Concentration:  Concentration: Good  Recall:  Good  Fund of Knowledge: Good  Language: Good  Assets:  Desire for Improvement  ADL's:  Intact  Cognition: WNL  Prognosis:  Good    DIAGNOSES:    ICD-10-CM   1. Generalized anxiety disorder  F41.1 Vilazodone HCl (VIIBRYD) 10 MG TABS    2. Major depressive disorder, recurrent episode, moderate (HCC)  F33.1 Vilazodone HCl (VIIBRYD) 10 MG TABS      Receiving Psychotherapy: Yes    RECOMMENDATIONS:  To reduce Viibryd 20 mg back down to 10 mg daily Continue Xanax 0.25  twice daily as needed To report side effects or worsening symptoms Emergency contact information provided Greater than 50% of 20 min face to face time with patient was spent on counseling and coordination of care. We discussed improvement with anxiety and depression but pt feels flat in which she says may be good thing for now. She has developed nausea and upset stomach nearly everyday for  last 5 weeks. She agreed to reduce dose to see if this alleviates discomfort and will advise in about 2 weeks.  Also discussed GeneSight testing and  multiple medication failures. Reviewed barriers to medications and fears. Patient is not sexually active and not on BC.       Joan Flores, NP

## 2021-06-26 NOTE — Telephone Encounter (Signed)
Please review

## 2021-06-26 NOTE — Telephone Encounter (Signed)
Yes both medication were sent to pharmacy.

## 2021-07-07 ENCOUNTER — Other Ambulatory Visit: Payer: Self-pay | Admitting: Behavioral Health

## 2021-07-07 ENCOUNTER — Telehealth: Payer: Self-pay | Admitting: Behavioral Health

## 2021-07-07 DIAGNOSIS — R45851 Suicidal ideations: Secondary | ICD-10-CM

## 2021-07-07 DIAGNOSIS — F411 Generalized anxiety disorder: Secondary | ICD-10-CM

## 2021-07-07 DIAGNOSIS — F332 Major depressive disorder, recurrent severe without psychotic features: Secondary | ICD-10-CM

## 2021-07-07 DIAGNOSIS — F349 Persistent mood [affective] disorder, unspecified: Secondary | ICD-10-CM

## 2021-07-07 MED ORDER — LATUDA 20 MG PO TABS: ORAL_TABLET | ORAL | 1 refills | Status: DC

## 2021-07-07 NOTE — Progress Notes (Signed)
Patient called in to inform that her depression and anxiety has not improved with Viibryd and she has experienced near constant nausea and gastric issues on a daily basis.  She has tried: Desvenlaxafine Buproprion Duloxetine Paroxetine Xanax Buspar Klonopin     She says that she has continued to have intrusive thoughts and suicidal ideation without a plan. She verbally contracted for safety and says that she has strong support network and feels she has resources for protection at this time. She does not want to try another antidepressant. She feels that she needs to try a new class of medication. She has had GeneSight testing completed and does not want to try another medication that is not in the recommended column. Says she is very sensitive to medication. She is requesting to try Latuda because it is in the green/recommended column.   She was provided emergency contact info if condition worsens. A script for Latuda 20 mg daily [facility] [place].

## 2021-07-07 NOTE — Telephone Encounter (Signed)
Please review

## 2021-07-07 NOTE — Telephone Encounter (Signed)
Called pt directly. See note. No further action needed at this time.

## 2021-07-07 NOTE — Telephone Encounter (Signed)
Pt called and advised that [name] asked her to continue to take 10 mg Viibryd for 2 more weeks, which she has done.  She has upset stomach almost everyday.  She has been on it now a total of 7 wks and would like to know what to do.

## 2021-07-18 ENCOUNTER — Telehealth: Payer: Self-pay | Admitting: Behavioral Health

## 2021-07-18 NOTE — Telephone Encounter (Signed)
I do not know the status of this. Traci do you have any knowledge of this.

## 2021-07-18 NOTE — Telephone Encounter (Signed)
Pt LVM stating that [name] prescibed Latuda for her.  Her insurance said it needed a PA. She wants to know if it has been submitted.  If not, she wants to cancel her appt with [name] on 9/29 as she won't have had enough time to take the medicine.  Pls advise her of the status of the PA and let admin know if we need to cancel the appt.

## 2021-07-18 NOTE — Telephone Encounter (Signed)
Suzanne Rogers, could also please advise the pt that we can provide her samples. Suzanne Rogers is going generic soon. Pt should not suffer without adequate medication. She would just have to come by office.

## 2021-07-18 NOTE — Telephone Encounter (Signed)
Please review

## 2021-07-21 NOTE — Telephone Encounter (Signed)
[name] her PA was approved for LATUDA 20 MG effective 07/21/2021-07/21/2024 with [facility]

## 2021-07-21 NOTE — Telephone Encounter (Signed)
Thank you Traci.

## 2021-07-21 NOTE — Telephone Encounter (Signed)
Her PA is pending. I had to send twice and may need to call today if no response.  Pt can come get samples any time.

## 2021-07-24 ENCOUNTER — Telehealth: Payer: BC Managed Care – PPO | Admitting: Behavioral Health

## 2021-08-13 ENCOUNTER — Encounter: Payer: Self-pay | Admitting: Behavioral Health

## 2021-08-13 ENCOUNTER — Telehealth (INDEPENDENT_AMBULATORY_CARE_PROVIDER_SITE_OTHER): Payer: BC Managed Care – PPO | Admitting: Behavioral Health

## 2021-08-13 DIAGNOSIS — R45851 Suicidal ideations: Secondary | ICD-10-CM | POA: Diagnosis not present

## 2021-08-13 DIAGNOSIS — F349 Persistent mood [affective] disorder, unspecified: Secondary | ICD-10-CM | POA: Diagnosis not present

## 2021-08-13 DIAGNOSIS — F332 Major depressive disorder, recurrent severe without psychotic features: Secondary | ICD-10-CM | POA: Diagnosis not present

## 2021-08-13 DIAGNOSIS — F411 Generalized anxiety disorder: Secondary | ICD-10-CM

## 2021-08-13 MED ORDER — LATUDA 20 MG PO TABS: ORAL_TABLET | ORAL | 2 refills | Status: DC

## 2021-08-13 NOTE — Progress Notes (Deleted)
[facility]  Patient ID: Suzanne Rogers,  MRN: [id number]  PCP: Benita Stabile, MD  Date of Evaluation: 08/13/2021 Time spent:{TIME; 0 MIN TO 60 [phone]  Chief Complaint:  Chief Complaint   Anxiety; Depression; Follow-up; Medication Refill     HISTORY/CURRENT STATUS: HPI  Individual Medical History/ Review of Systems: Changes? :{EXAM; YES/NO:21197}  Allergies: Patient has no known allergies.  Current Medications:  Current Outpatient Medications:    ALPRAZolam (XANAX) 0.25 MG tablet, TAKE 1 TABLET BY MOUTH TWICE A DAY AS NEEDED FOR ANXIETY, Disp: 30 tablet, Rfl: 1   hydroxychloroquine (PLAQUENIL) 200 MG tablet, Take by mouth., Disp: , Rfl:    lurasidone (LATUDA) 20 MG TABS tablet, Take in the morning daily with 350 cal meal., Disp: 30 tablet, Rfl: 2   meloxicam (MOBIC) 15 MG tablet, Take 1 tablet by mouth daily., Disp: , Rfl:    Multiple Vitamin (MULTIVITAMIN) tablet, Take 1 tablet by mouth daily., Disp: , Rfl:  Medication Side Effects: {Medication Side Effects (Optional):12147}  Family Medical/ Social History: Changes? {EXAM; YES/NO:19492}  MENTAL HEALTH EXAM:  There were no vitals taken for this visit.There is no height or weight on file to calculate BMI.  General Appearance: {[phone]}  Eye Contact:  {PSY:22684}  Speech:  {[phone]}  Volume:  {PSY:22686}  Mood:  {PSY:22306}  Affect:  {PSY:[phone]}  Thought Process:  {PSY:22688}  Orientation:  {PSY:22689}  Thought Content: {PSYt:22690}   Suicidal Thoughts:  {PSY:22692}  Homicidal Thoughts:  {PSY:22692}  Memory:  {[phone]}  Judgement:  {PSY:22694}  Insight:  {PSY:22695}  Psychomotor Activity:  {PSY:22696}  Concentration:  {PSY:21399}  Recall:  {PSY:22877}  Fund of Knowledge: {PSY:22877}  Language: {TDD:22025}  Assets:  {PSY:22698}  ADL's:  {PSY:22290}  Cognition: {PSY:[id number]}  Prognosis:  {PSY:22877}    DIAGNOSES:    ICD-10-CM   1. Generalized anxiety disorder   F41.1 lurasidone (LATUDA) 20 MG TABS tablet    2. Persistent mood (affective) disorder, unspecified (HCC)  F34.9 lurasidone (LATUDA) 20 MG TABS tablet    3. Suicidal ideation  R45.851 lurasidone (LATUDA) 20 MG TABS tablet    4. Severe episode of recurrent major depressive disorder, without psychotic features (HCC)  F33.2 lurasidone (LATUDA) 20 MG TABS tablet      Receiving Psychotherapy: {KYH:06237}   RECOMMENDATIONS: ***   Joan Flores, NP

## 2021-08-13 NOTE — Progress Notes (Addendum)
Suzanne Rogers [id number] Jan 05, 1981 40 y.o.  Virtual Visit via Video Note  I connected with pt @ on 08/13/21 at  3:00 PM EDT by a video enabled telemedicine application and verified that I am speaking with the correct person using two identifiers.   I discussed the limitations of evaluation and management by telemedicine and the availability of in person appointments. The patient expressed understanding and agreed to proceed.  I discussed the assessment and treatment plan with the patient. The patient was provided an opportunity to ask questions and all were answered. The patient agreed with the plan and demonstrated an understanding of the instructions.   The patient was advised to call back or seek an in-person evaluation if the symptoms worsen or if the condition fails to improve as anticipated.  I provided 20  minutes of non-face-to-face time during this encounter.  The patient was located at home.  The provider was located at [facility].   Joan Flores, NP   Subjective:   Patient ID:  Suzanne Rogers is a 40 y.o. (DOB [birth year]) female.  Chief Complaint:  Chief Complaint  Patient presents with   Anxiety   Depression   Follow-up   Medication Refill    HPI Suzanne Rogers presents for follow-up and medication refill. She says she has now been on Latuda for approximately 3 weeks and is starting to feel some relief from depression and anxiety. She says depression has improved more than the anxiety, but both better overall. She does not want to consider dosage changes till at least week 6. She says her anxiety level today is 4/10 and depression is 3/10. She is sleeping 7-8 hours at night. She does take the Latuda at bedtime. Denies mania, no psychosis, Denies current SI/HI.   Previous psychiatric medication trials: Desvenlaxafine Buproprion Duloxetine Paroxetine Xanax Buspar Klonopin  Viibryd     Review of Systems:  Review of Systems  Constitutional: Negative.    Cardiovascular:  Negative for palpitations.  Allergic/Immunologic: Negative.   Neurological:  Negative for tremors and weakness.  Psychiatric/Behavioral:  Positive for dysphoric mood. The patient is nervous/anxious.    Medications: I have reviewed the patient's current medications.  Current Outpatient Medications  Medication Sig Dispense Refill   ALPRAZolam (XANAX) 0.25 MG tablet TAKE 1 TABLET BY MOUTH TWICE A DAY AS NEEDED FOR ANXIETY 30 tablet 1   hydroxychloroquine (PLAQUENIL) 200 MG tablet Take by mouth.     lurasidone (LATUDA) 20 MG TABS tablet Take in the morning daily with 350 cal meal. 30 tablet 2   meloxicam (MOBIC) 15 MG tablet Take 1 tablet by mouth daily.     Multiple Vitamin (MULTIVITAMIN) tablet Take 1 tablet by mouth daily.     No current facility-administered medications for this visit.    Medication Side Effects: None  Allergies: No Known Allergies  Past Medical History:  Diagnosis Date   Connective tissue disorder (HCC)    Mental disorder    anx/dep    Family History  Problem Relation Age of Onset   Dementia Mother    Alzheimer's disease Mother    Anxiety disorder Father    Depression Father    Heart attack Father    Anxiety disorder Sister    Depression Sister    Miscarriages / Stillbirths Sister    Endometriosis Sister    Depression Paternal Uncle    Anxiety disorder Paternal Uncle    Heart attack Maternal Grandfather    Diabetes Paternal Grandfather    Cancer Paternal  Grandmother        breast    Social History   Socioeconomic History   Marital status: Single    Spouse name: Not on file   Number of children: Not on file   Years of education: 23   Highest education level: Master's degree (e.g., MA, MS, MEng, MEd, MSW, MBA)  Occupational History   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  Likes to run. Likes to read and learn.    Social Determinants of Health   Financial Resource Strain: Low Risk    Difficulty of Paying Living Expenses: Not hard at all  Food Insecurity: No Food Insecurity   Worried About Programme researcher, broadcasting/film/video in the Last Year: Never true   Ran Out of Food in the Last Year: Never true  Transportation Needs: No Transportation Needs   Lack of Transportation (Medical): No   Lack of Transportation (Non-Medical): No  Physical Activity: Sufficiently Active   Days of Exercise per Week: 7 days   Minutes of Exercise per Session: 30 min  Stress: Stress Concern Present   Feeling of Stress : Rather much  Social Connections: Moderately Integrated   Frequency of Communication with Friends and Family: Once a week   Frequency of Social Gatherings with Friends and Family: Twice a week   Attends Religious Services: More than 4 times per year   Active Member of [employer] or Organizations: Yes   Attends Banker Meetings: 1 to 4 times per year   Marital Status: Never married  Catering manager Violence: Not At Risk   Fear of Current or Ex-Partner: No   Emotionally Abused: No   Physically Abused: No   Sexually Abused: No    Past Medical History, Surgical history, Social history, and Family history were reviewed and updated as appropriate.   Please see review of systems for further details on the patient's review from today.   Objective:   Physical Exam:  There were no vitals taken for this visit.  Physical Exam Neurological:     Mental Status: She is alert and oriented to person, place, and time.  Psychiatric:        Attention and Perception: Attention and perception normal.        Mood and Affect: Mood normal.         Speech: Speech normal.        Behavior: Behavior normal. Behavior is cooperative.        Cognition and Memory: Cognition and memory normal.        Judgment: Judgment normal.     Comments: Insight intact    Lab Review:  No results found for: NA, K, CL, CO2, GLUCOSE, BUN, CREATININE, CALCIUM, PROT, ALBUMIN, AST, ALT, ALKPHOS, BILITOT, GFRNONAA, GFRAA  No results found for: WBC, RBC, HGB, HCT, PLT, MCV, MCH, MCHC, RDW, LYMPHSABS, MONOABS, EOSABS, BASOSABS  No results found for: POCLITH, LITHIUM   No results found for: PHENYTOIN, PHENOBARB, VALPROATE, CBMZ   .res Assessment: Plan:    Fayola was seen today  for anxiety, depression, follow-up and medication refill.  Diagnoses and all orders for this visit:  Generalized anxiety disorder -     lurasidone (LATUDA) 20 MG TABS tablet; Take in the morning daily with 350 cal meal.  Persistent mood (affective) disorder, unspecified (HCC) -     lurasidone (LATUDA) 20 MG TABS tablet; Take in the morning daily with 350 cal meal.  Suicidal ideation -     lurasidone (LATUDA) 20 MG TABS tablet; Take in the morning daily with 350 cal meal.  Severe episode of recurrent major depressive disorder, without psychotic features (HCC) -     lurasidone (LATUDA) 20 MG TABS tablet; Take in the morning daily with 350 cal meal.   Stopped Viibryd 20 mg via telephone order Started Latuda 20 mg tablet via telephone. Has been on 3 weeks as of this video visit.  Continue Xanax 0.25  twice daily as needed To report side effects or worsening symptoms Emergency contact information provided Greater than 50% of 20 min face to face time with patient was spent on counseling and coordination of care. We discussed her moderate improvement with depression and slight improvement with anxiety. She has been having less intrusive thoughts and suicidal ideation. She feels safe at this time and has contracted for safety. She has strong fried base for support. She want to wait 6  weeks before considering dosage changes. Reviewed barriers to medications and fears. Patient is not sexually active and not on BC. Reviewed PDMP       Please see After Visit Summary for patient specific instructions.  Future Appointments  Date Time Provider Department Center  09/10/2021  1:00 PM Avelina Laine A, NP [facility] None    No orders of the defined types were placed in this encounter.     -------------------------------

## 2021-09-10 ENCOUNTER — Telehealth: Payer: BC Managed Care – PPO | Admitting: Behavioral Health

## 2021-09-22 ENCOUNTER — Encounter: Payer: Self-pay | Admitting: Behavioral Health

## 2021-09-22 ENCOUNTER — Telehealth (INDEPENDENT_AMBULATORY_CARE_PROVIDER_SITE_OTHER): Payer: BC Managed Care – PPO | Admitting: Behavioral Health

## 2021-09-22 DIAGNOSIS — F331 Major depressive disorder, recurrent, moderate: Secondary | ICD-10-CM | POA: Diagnosis not present

## 2021-09-22 DIAGNOSIS — F349 Persistent mood [affective] disorder, unspecified: Secondary | ICD-10-CM | POA: Diagnosis not present

## 2021-09-22 DIAGNOSIS — F411 Generalized anxiety disorder: Secondary | ICD-10-CM | POA: Diagnosis not present

## 2021-09-22 DIAGNOSIS — F332 Major depressive disorder, recurrent severe without psychotic features: Secondary | ICD-10-CM | POA: Diagnosis not present

## 2021-09-22 NOTE — Progress Notes (Signed)
Suzanne Rogers [id number] 04/23/1981 40 y.o.  Virtual Visit via Video Note  I connected with pt @ on 09/22/21 at  1:00 PM EST by a video enabled telemedicine application and verified that I am speaking with the correct person using two identifiers.   I discussed the limitations of evaluation and management by telemedicine and the availability of in person appointments. The patient expressed understanding and agreed to proceed.  I discussed the assessment and treatment plan with the patient. The patient was provided an opportunity to ask questions and all were answered. The patient agreed with the plan and demonstrated an understanding of the instructions.   The patient was advised to call back or seek an in-person evaluation if the symptoms worsen or if the condition fails to improve as anticipated.  I provided 20 min minutes of non-face-to-face time during this encounter.  The patient was located at home.  The provider was located at [facility].   Elwanda Brooklyn, NP   Subjective:   Patient ID:  Suzanne Rogers is a 40 y.o. (DOB 1981-05-06) female.  Chief Complaint:  Chief Complaint  Patient presents with   Anxiety   Depression   Medication Refill   Medication Problem   Follow-up     HPI Suzanne Rogers presents for follow-up and medication management. She says that she feels slight improvement for the Latuda 20 mg but still feels detached, flat, and sadness. She says that she would like to consider dosage increase at this time. She said that the Thanksgiving holiday was tough due to family dynamics but she made it through it. 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 Reports her anxiety today at 6/10 and depression at 5/10. She is sleeping 7-8 hours per night.  She has had reduction in SI recently. She feels safe at this time. Verbally contacted for safety with this Probation officer. No mania, no psychosis, occasional SI without plan. No HI.  Previous  psychiatric medication trials: Desvenlaxafine Buproprion Duloxetine Paroxetine Xanax Buspar Klonopin  Viibryd    Review of Systems:  Review of Systems  Constitutional: Negative.   Cardiovascular:  Negative for palpitations.  Allergic/Immunologic: Negative.   Neurological: Negative.  Negative for tremors and weakness.  Psychiatric/Behavioral:  Positive for decreased concentration, dysphoric mood and suicidal ideas. The patient is nervous/anxious.    Medications: I have reviewed the patient's current medications.  Current Outpatient Medications  Medication Sig Dispense Refill   ALPRAZolam (XANAX) 0.25 MG tablet TAKE 1 TABLET BY MOUTH TWICE A DAY AS NEEDED FOR ANXIETY 30 tablet 1   hydroxychloroquine (PLAQUENIL) 200 MG tablet Take by mouth.     lurasidone (LATUDA) 20 MG TABS tablet Take in the morning daily with 350 cal meal. 30 tablet 2   meloxicam (MOBIC) 15 MG tablet Take 1 tablet by mouth daily.     Multiple Vitamin (MULTIVITAMIN) tablet Take 1 tablet by mouth daily.     No current facility-administered medications for this visit.    Medication Side Effects: None  Allergies: No Known Allergies  Past Medical History:  Diagnosis Date   Connective tissue disorder ([place])    Mental disorder    anx/dep    Family History  Problem Relation Age of Onset   Dementia Mother    Alzheimer's disease Mother    Anxiety disorder Father    Depression Father    Heart attack Father    Anxiety disorder Sister    Depression Sister    Miscarriages /  Stillbirths Sister    Endometriosis Sister    Depression Paternal Uncle    Anxiety disorder Paternal Uncle    Heart attack Maternal Grandfather    Diabetes Paternal Grandfather    Cancer Paternal Grandmother        breast    Social History   Socioeconomic History   Marital status: Single    Spouse name: Not on file   Number of children: Not on file   Years of education: 18   Highest education level: Master's degree (e.g., MA,  MS, MEng, MEd, MSW, MBA)  Occupational History   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  Likes to run. Likes to read and learn.    Social Determinants of Health   Financial Resource Strain: Low Risk    Difficulty of Paying Living Expenses: Not hard at all  Food Insecurity: No Food Insecurity   Worried About Programme researcher, broadcasting/film/video in the Last Year: Never true   Ran Out of Food in the Last Year: Never true  Transportation Needs: No Transportation Needs   Lack of Transportation (Medical): No   Lack of Transportation (Non-Medical): No  Physical Activity: Sufficiently Active   Days of Exercise per Week: 7 days   Minutes of Exercise per Session: 30 min  Stress: Stress Concern Present   Feeling of Stress : Rather much  Social Connections: Moderately Integrated   Frequency of Communication with Friends and Family: Once a week   Frequency of Social Gatherings with Friends and Family: Twice a week   Attends Religious Services: More than 4 times per year   Active Member of [employer] or Organizations: Yes   Attends Banker Meetings: 1 to 4 times per year   Marital Status: Never married  Catering manager Violence: Not At Risk   Fear of Current or Ex-Partner: No   Emotionally Abused: No   Physically Abused: No   Sexually Abused: No    Past Medical History, Surgical history, Social history, and Family history were reviewed and updated as appropriate.   Please see review of systems for further details on the patient's review from today.   Objective:   Physical Exam:  There were no vitals taken for  this visit.  Physical Exam Neurological:     Mental Status: She is alert and oriented to person, place, and time.  Psychiatric:        Attention and Perception: Attention and perception normal.        Mood and Affect: Mood normal.        Speech: Speech normal.        Behavior: Behavior normal. Behavior is cooperative.        Cognition and Memory: Cognition and memory normal.        Judgment: Judgment normal.     Comments: Insight intact    Lab Review:  No results found for: NA, K, CL, CO2, GLUCOSE, BUN, CREATININE, CALCIUM, PROT, ALBUMIN, AST, ALT, ALKPHOS, BILITOT, GFRNONAA, GFRAA  No results found for: WBC, RBC, HGB, HCT,  PLT, MCV, MCH, MCHC, RDW, LYMPHSABS, MONOABS, EOSABS, BASOSABS  No results found for: POCLITH, LITHIUM   No results found for: PHENYTOIN, PHENOBARB, VALPROATE, CBMZ   .res Assessment: Plan:    Suzanne Rogers was seen today for anxiety, depression, medication refill, medication problem and follow-up.  Diagnoses and all orders for this visit:  Generalized anxiety disorder  Persistent mood (affective) disorder, unspecified (HCC)  Severe episode of recurrent major depressive disorder, without psychotic features ([place])  Major depressive disorder, recurrent episode, moderate (HCC)   Plan/Recommendations  Increase  Latuda 20 mg tablet to 40 mg daily.   Continue Xanax 0.25  twice daily as needed To report side effects or worsening symptoms Emergency contact information provided Greater than 50% of 20 min face to face time with patient limited improvement with depression and slight improvement with anxiety. She has been having less intrusive thoughts and suicidal ideation. She feels safe at this time and has contracted for safety. She has strong friend base for support. Reviewed barriers to medications and fears. Patient is not sexually active and not on BC. Reviewed PDMP   Louanna Raw. Clarke Peretz, PMHNP-BC   Please see After Visit Summary for patient specific  instructions.  No future appointments.  No orders of the defined types were placed in this encounter.     -------------------------------

## 2021-10-06 ENCOUNTER — Telehealth: Payer: Self-pay | Admitting: Behavioral Health

## 2021-10-06 ENCOUNTER — Other Ambulatory Visit: Payer: Self-pay

## 2021-10-06 MED ORDER — LURASIDONE HCL 40 MG PO TABS: 40.0000 mg | ORAL_TABLET | Freq: Every day | ORAL | 1 refills | Status: AC

## 2021-10-06 NOTE — Telephone Encounter (Signed)
Rx was sent today.

## 2021-10-06 NOTE — Telephone Encounter (Signed)
Pt LM on VM. Provider increased Latuda to 2 20 mg daily. Ran out early due to increase. Pt needs New Rx sent ASAP. LM 12/9  and is out today. [facility].

## 2021-10-22 ENCOUNTER — Telehealth: Payer: Self-pay | Admitting: Behavioral Health

## 2021-10-22 NOTE — Telephone Encounter (Signed)
See telephone encounter on 12/12. Patient lm stating since the increase of Latuda the anxiety seems to be worse. Please advise. # [id number].

## 2021-10-22 NOTE — Telephone Encounter (Signed)
Rtc to pt and she reports with the increase of Latuda to 40 mg her anxiety worsened. Advised her to decrease back to 20 mg, then I will touch base with Suzanne Rogers on any other changes prior to her apt if needed.

## 2021-10-24 NOTE — Telephone Encounter (Signed)
I called the patient directly. She will reduce dosage for one week to see if anxiety subsides. We agreed to discuss medication changes on her next visit on 1/16. She will call back if symptoms worsen. Thanks all for your help.

## 2021-10-28 ENCOUNTER — Telehealth: Payer: Self-pay | Admitting: Behavioral Health

## 2021-10-28 NOTE — Telephone Encounter (Signed)
Pt left a message and said that the adjustment on the medcine is not helping. She is crying all the time having significant anxiety and sucidal ideation. She feels safe and has no plan to harm herself. She needs to know what the next step should be. Please call her at [phone]

## 2021-10-28 NOTE — Telephone Encounter (Signed)
Please review

## 2021-10-29 NOTE — Telephone Encounter (Signed)
Attempted to contact on number provided. Please reach out to her tomorrow and ask her best contact time, preferably in the afternoon and I will try again.

## 2021-10-30 NOTE — Telephone Encounter (Signed)
LVM to rtc 

## 2021-11-03 ENCOUNTER — Telehealth: Payer: Self-pay | Admitting: Behavioral Health

## 2021-11-03 NOTE — Telephone Encounter (Signed)
Please review

## 2021-11-03 NOTE — Telephone Encounter (Signed)
Suzanne Rogers at [employer] called to talk to [name] about pt.  She is to bring the release of information form to [place] today and drop it the front desk.  Then she wants him to call her tomorrow between 12-1 if possible.  Next appt 1/16

## 2021-11-04 ENCOUNTER — Ambulatory Visit: Payer: BC Managed Care – PPO | Admitting: Behavioral Health

## 2021-11-04 NOTE — Telephone Encounter (Signed)
[name] called back again today.  She asked if Aaron Edelman can call her back today from 1-2pm, if possible.  I told her he is booked but in case he has a cancellation, I would give him the message.  As a last resort, I show he currently has a block of time open Thur at 11:15.  Stanton Kidney said she would like to speak to him before Thurs, but if he can call her on Thurs at [phone], she is available 11:15-11:30.

## 2021-11-04 NOTE — Telephone Encounter (Signed)
Please review

## 2021-11-06 ENCOUNTER — Other Ambulatory Visit: Payer: Self-pay | Admitting: Behavioral Health

## 2021-11-06 NOTE — Telephone Encounter (Signed)
Followed up directly with both therapist and pt. Pt has appt on Monday, 1/16. No further action needed.

## 2021-11-10 ENCOUNTER — Ambulatory Visit: Payer: BC Managed Care – PPO | Admitting: Behavioral Health

## 2021-11-10 ENCOUNTER — Encounter: Payer: Self-pay | Admitting: Behavioral Health

## 2021-11-10 ENCOUNTER — Other Ambulatory Visit: Payer: Self-pay

## 2021-11-10 DIAGNOSIS — F331 Major depressive disorder, recurrent, moderate: Secondary | ICD-10-CM | POA: Diagnosis not present

## 2021-11-10 DIAGNOSIS — F411 Generalized anxiety disorder: Secondary | ICD-10-CM

## 2021-11-10 DIAGNOSIS — R45851 Suicidal ideations: Secondary | ICD-10-CM

## 2021-11-10 DIAGNOSIS — F422 Mixed obsessional thoughts and acts: Secondary | ICD-10-CM

## 2021-11-10 DIAGNOSIS — F349 Persistent mood [affective] disorder, unspecified: Secondary | ICD-10-CM

## 2021-11-10 DIAGNOSIS — F332 Major depressive disorder, recurrent severe without psychotic features: Secondary | ICD-10-CM

## 2021-11-10 NOTE — Progress Notes (Signed)
[facility]  Patient ID: Suzanne Rogers,  MRN: [id number]  PCP: Celene Squibb, MD  Date of Evaluation: 11/10/2021 Time spent:30 minutes  Chief Complaint:  Chief Complaint   Anxiety; Depression; OCD; Medication Problem; Follow-up     HISTORY/CURRENT STATUS: HPI  Suzanne Rogers presents for follow-up and medication management. Says that she has experienced slightly decreased anxiety since stopping the [place]. She is open to trying Vraylar. She consented in her therapist reaching out to me last week providing more insight. She will log her moods and anxiety levels for the next few weeks while initiating Vraylar.  Reports her anxiety today at 7/10 and depression at 5/10. She is sleeping 7-8 hours per night.  She has had reduction in SI recently. She feels safe at this time. Strong friend support and she does use her resources when needed. Verbally contacted for safety with this Probation officer. No mania, no psychosis, occasional SI without plan. No HI.   Previous psychiatric medication trials: Desvenlaxafine Buproprion Duloxetine Paroxetine Xanax Buspar Klonopin  Viibryd Latuda, Increased anxiety to severe levels   Individual Medical History/ Review of Systems: Changes? :No   Allergies: Patient has no known allergies.  Current Medications:  Current Outpatient Medications:    lurasidone (LATUDA) 40 MG TABS tablet, Take 1 tablet (40 mg total) by mouth daily with breakfast., Disp: 30 tablet, Rfl: 1   ALPRAZolam (XANAX) 0.25 MG tablet, TAKE 1 TABLET BY MOUTH TWICE A DAY AS NEEDED FOR ANXIETY, Disp: 30 tablet, Rfl: 1   hydroxychloroquine (PLAQUENIL) 200 MG tablet, Take by mouth., Disp: , Rfl:    meloxicam (MOBIC) 15 MG tablet, Take 1 tablet by mouth daily., Disp: , Rfl:    Multiple Vitamin (MULTIVITAMIN) tablet, Take 1 tablet by mouth daily., Disp: , Rfl:  Medication Side Effects: none  Family Medical/ Social History: Changes? No  MENTAL HEALTH EXAM:  There were no vitals taken  for this visit.There is no height or weight on file to calculate BMI.  General Appearance: Well Groomed  Eye Contact:  Good  Speech:  Clear and Coherent  Volume:  Normal  Mood:  Anxious, Depressed, and Dysphoric  Affect:  Depressed, Flat, and Anxious  Thought Process:  Coherent  Orientation:  Full (Time, Place, and Person)  Thought Content: Logical   Suicidal Thoughts:  Yes.  without intent/plan  Homicidal Thoughts:  No  Memory:  WNL  Judgement:  Good  Insight:  Good  Psychomotor Activity:  Normal  Concentration:  Concentration: Good  Recall:  Good  Fund of Knowledge: Good  Language: Good  Assets:  Desire for Improvement  ADL's:  Intact  Cognition: WNL  Prognosis:  Good    DIAGNOSES: No diagnosis found.  Receiving Psychotherapy: yes   RECOMMENDATIONS:   Stopped the Latuda To start Vraylar 1.5 mg tablet daily at bedtime  Continue Xanax 0.25  twice daily as needed To report side effects or worsening symptoms Emergency contact information provided Greater than 50% of 20 min face to face time with patient limited improvement with depression and slight improvement with anxiety since stopping the Latuda. She has been having less intrusive thoughts and suicidal ideation overall but they are still present on a regular basis. Continues to struggle with intrusive thoughts and compulsions. She feels safe at this time and has contracted for safety. She has strong friend base for support. Reviewed barriers to medications and fears. Patient is not sexually active and not on BC. Reviewed PDMP     Louanna Raw. Angeliz Settlemyre, PMHNP-BC  Elwanda Brooklyn, NP

## 2021-11-18 ENCOUNTER — Telehealth: Payer: Self-pay | Admitting: Behavioral Health

## 2021-11-18 ENCOUNTER — Encounter: Payer: Self-pay | Admitting: Adult Health Nurse Practitioner

## 2021-11-18 NOTE — Telephone Encounter (Signed)
Noted. Thank you for taking the message.

## 2021-11-18 NOTE — Telephone Encounter (Signed)
Please see note from patient.

## 2021-11-18 NOTE — Telephone Encounter (Signed)
Pt left a message to let brian know that she is tolerating the vraylar. She said she has a headache daily. However she feels that it is helping with her depression and her anxiety is not worse. Please call her  if you have any other questions at [phone]

## 2021-11-25 ENCOUNTER — Telehealth: Payer: Self-pay | Admitting: Behavioral Health

## 2021-11-25 NOTE — Telephone Encounter (Signed)
Patient said at this time she is not interested in taking medication and declined trazodone. She said she has a really hard time with medications. She is taking melatonin, she didn't know the dose. She said the sleeping issue had only been a couple of weeks and if it continues to will revisit the trazodone. She has an appt with you on 2/13.

## 2021-11-25 NOTE — Telephone Encounter (Signed)
Pt LVM stating that [name] wanted her to call back weekly to report on how the Leafy Kindle was working for her.  She said she is not sleeping well and feels restless.  She does NOT have suicidaI ideation, but she feels no improvement of anxiety or depression; anxiety is not getting better.  Next appt 2/13

## 2021-11-25 NOTE — Telephone Encounter (Signed)
See message from patient. Called to ask about sleep hygiene. She said she gets to sleep, just can't stay asleep. She said most of the time she is able to go back to sleep after a few minutes. This morning she said she woke at 4:00 and couldn't go back to sleep.  Usually goes to bed the same time every night, usually no caffeine after 3. She says she just lays in bed when she can't sleep - no TV or looking at her phone.

## 2021-11-25 NOTE — Telephone Encounter (Signed)
Please call her and ask her if she has tried Trazodone? Usually this works well for this type of problem. If she has not, we can try 50 mg and see if that may help.

## 2021-11-25 NOTE — Telephone Encounter (Signed)
Ok, thanks for letting me know. Noted.

## 2021-11-25 NOTE — Telephone Encounter (Signed)
LVM to RC 

## 2021-12-08 ENCOUNTER — Ambulatory Visit: Payer: BC Managed Care – PPO | Admitting: Behavioral Health

## 2021-12-08 ENCOUNTER — Encounter: Payer: Self-pay | Admitting: Behavioral Health

## 2021-12-08 ENCOUNTER — Other Ambulatory Visit: Payer: Self-pay

## 2021-12-08 DIAGNOSIS — F349 Persistent mood [affective] disorder, unspecified: Secondary | ICD-10-CM | POA: Diagnosis not present

## 2021-12-08 DIAGNOSIS — F332 Major depressive disorder, recurrent severe without psychotic features: Secondary | ICD-10-CM | POA: Diagnosis not present

## 2021-12-08 DIAGNOSIS — F411 Generalized anxiety disorder: Secondary | ICD-10-CM

## 2021-12-08 DIAGNOSIS — F331 Major depressive disorder, recurrent, moderate: Secondary | ICD-10-CM | POA: Diagnosis not present

## 2021-12-08 DIAGNOSIS — F422 Mixed obsessional thoughts and acts: Secondary | ICD-10-CM

## 2021-12-08 IMAGING — MG MM DIGITAL SCREENING BILAT W/ TOMO AND CAD
8 series · 9 of 24 positions shown · non-contrast
Comparison: None.

CLINICAL DATA: Screening.

EXAM:
DIGITAL SCREENING BILATERAL MAMMOGRAM WITH TOMOSYNTHESIS AND CAD
TECHNIQUE: Bilateral screening digital craniocaudal and mediolateral oblique
mammograms were obtained. Bilateral screening digital breast
tomosynthesis was performed. The images were evaluated with
computer-aided detection.

[L CC synth-2D]
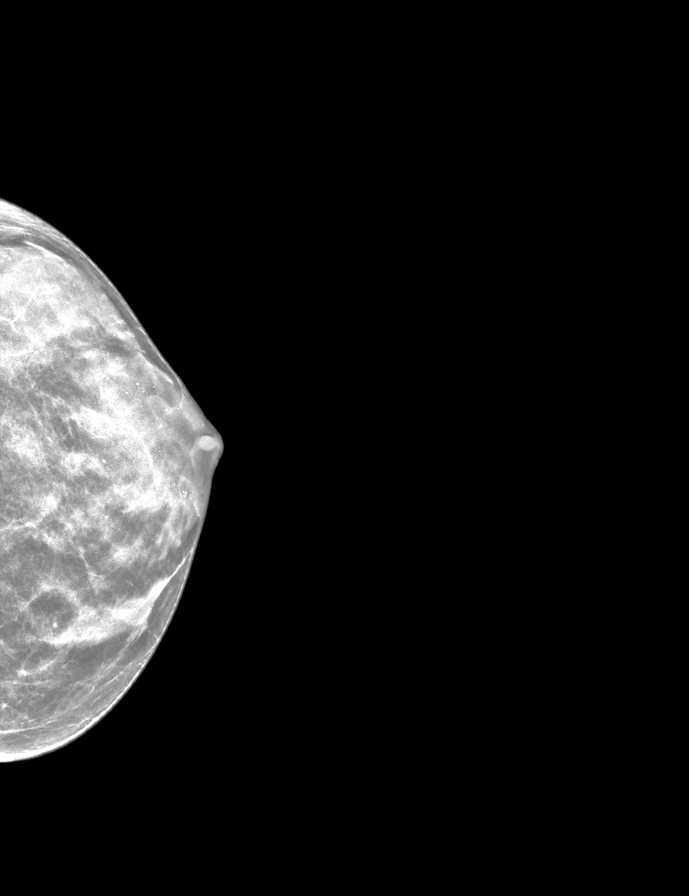

[R MLO synth-2D]
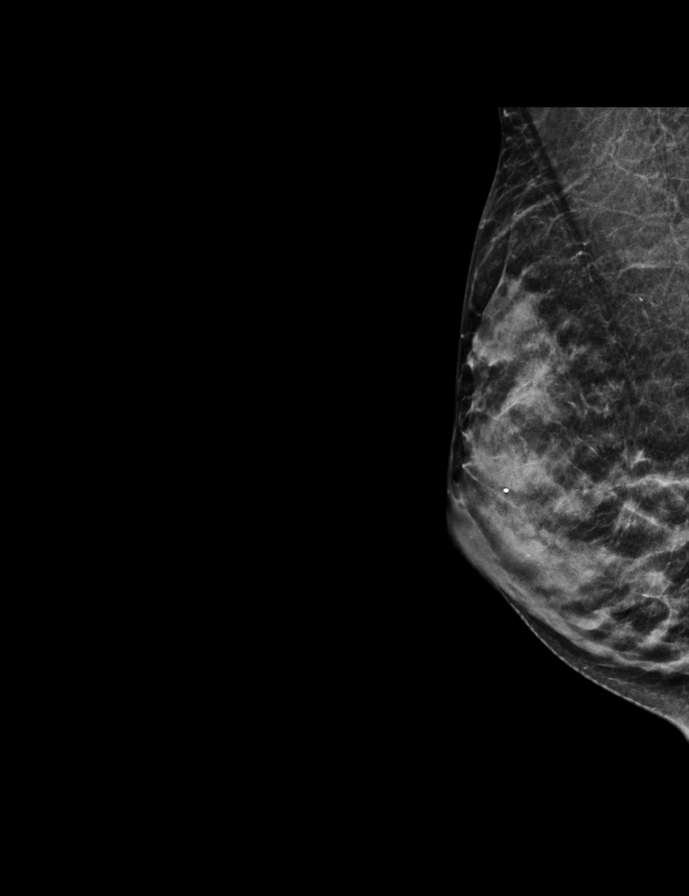

[R CC synth-2D]
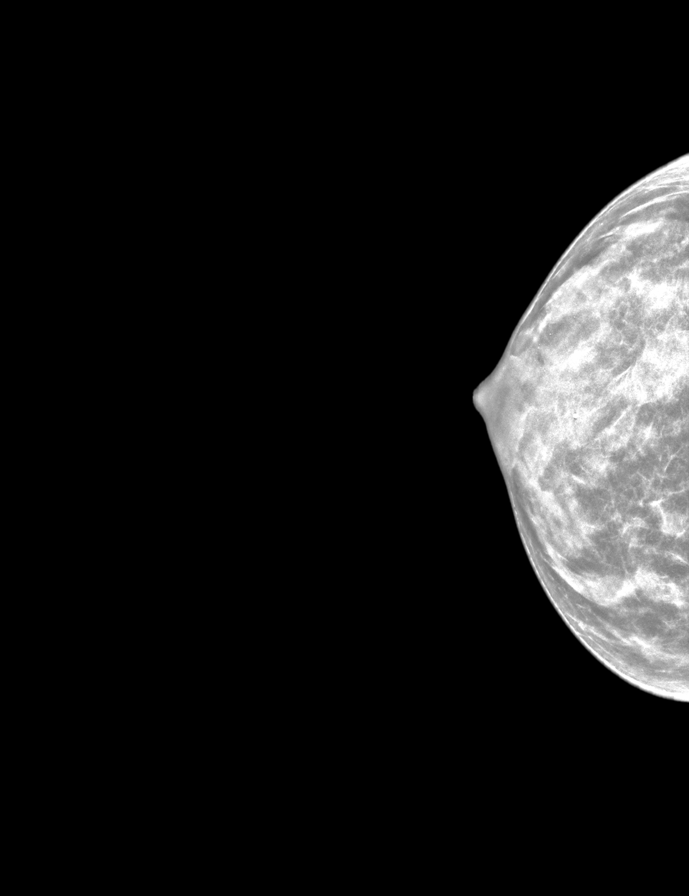

[L MLO synth-2D]
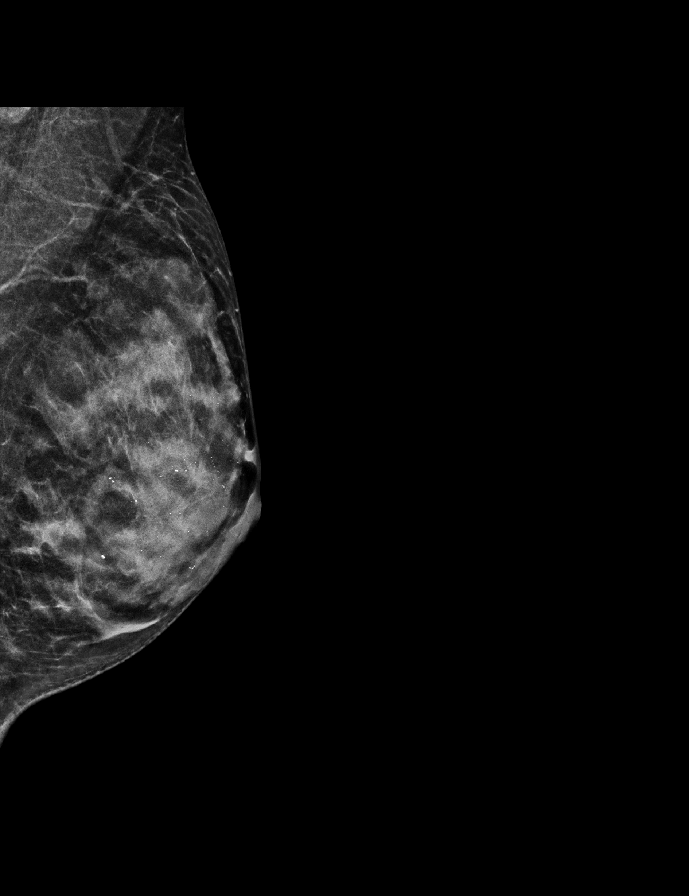

[L MLO tomo · 2 of 45 frames shown]
[frame 15/45]
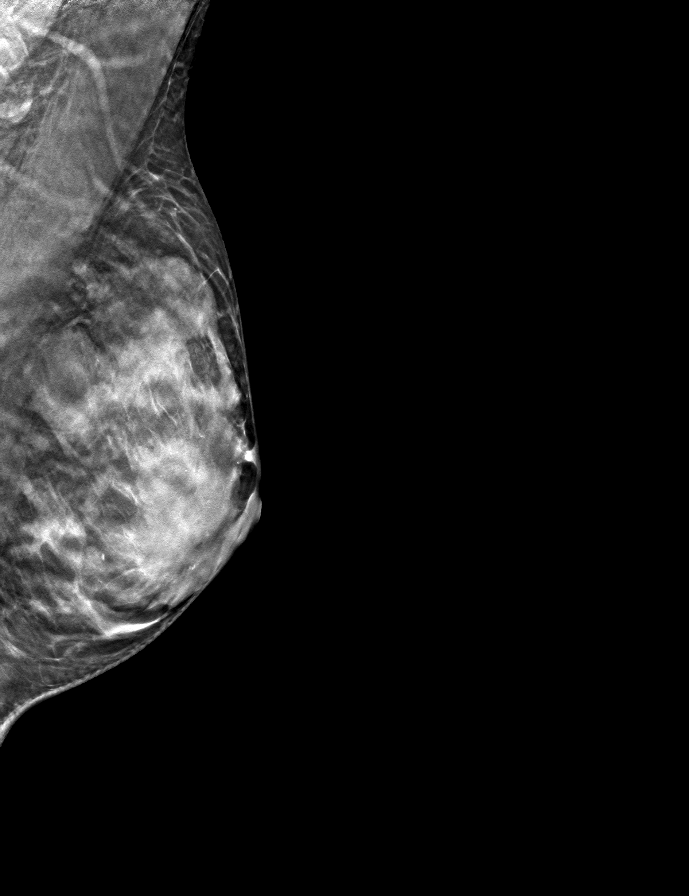
[frame 23/45]
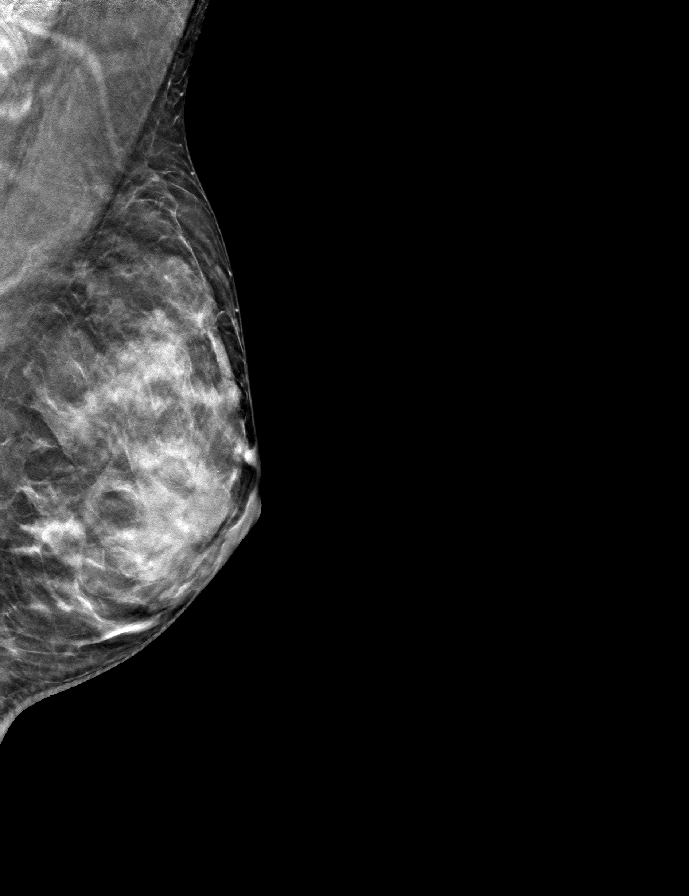

[R CC tomo · tomo slice 21/41.0]
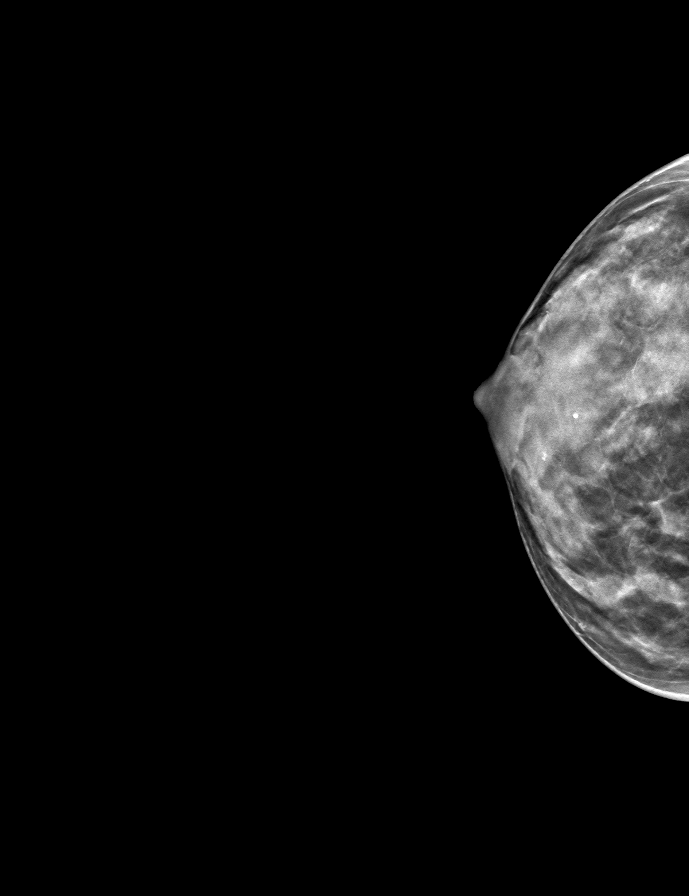

[L CC tomo · tomo slice 21/42.0]
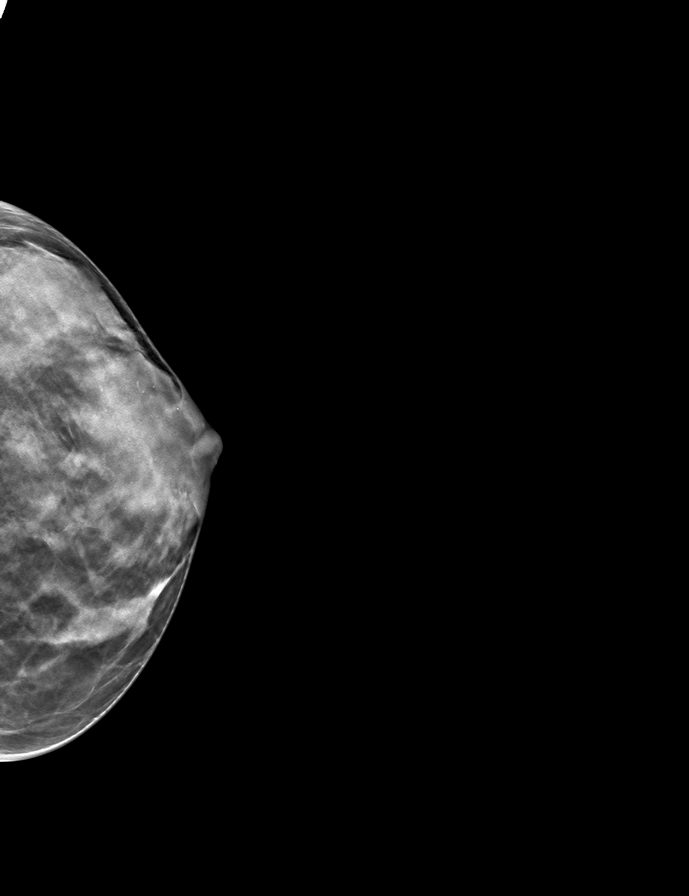

[R MLO tomo · tomo slice 21/42.0]
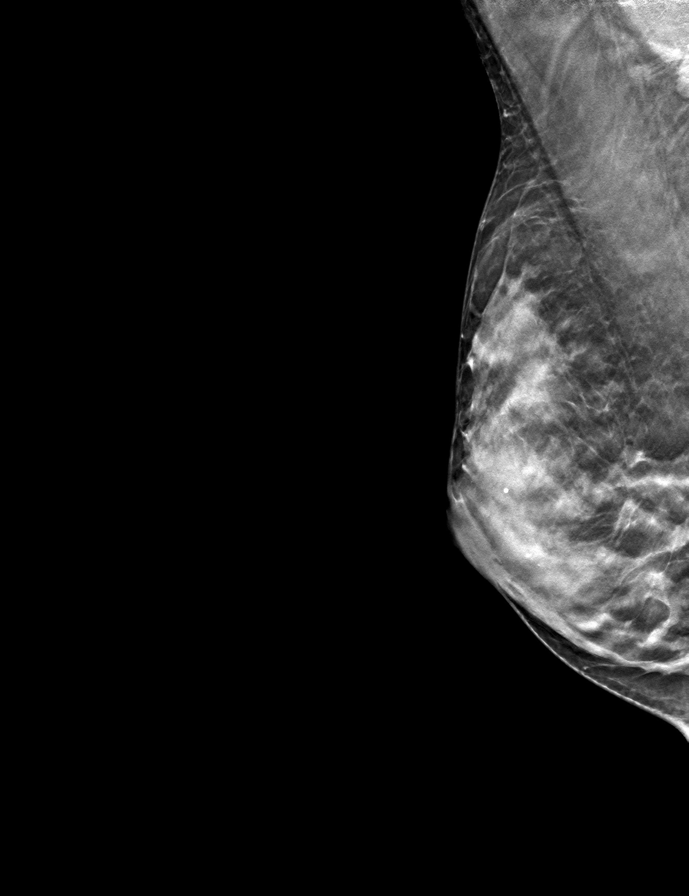

[9 of 24 positions shown; findings below may reference images not displayed]

ACR Breast Density Category c: The breast tissue is heterogeneously
dense, which may obscure small masses
FINDINGS: There are no findings suspicious for malignancy.
IMPRESSION: No mammographic evidence of malignancy. A result letter of this
screening mammogram will be mailed directly to the patient.

RECOMMENDATION:
Screening mammogram in one year. (Code:C8-T-HNK)

BI-RADS CATEGORY  1: Negative.

## 2021-12-08 MED ORDER — BUSPIRONE HCL 15 MG PO TABS: ORAL_TABLET | ORAL | 1 refills | Status: DC

## 2021-12-08 NOTE — Progress Notes (Signed)
[facility]  Patient ID: Suzanne Rogers,  MRN: [id number]  PCP: Celene Squibb, MD  Date of Evaluation: 12/08/2021 Time spent:30 minutes  Chief Complaint:  Chief Complaint   Anxiety; Depression; Follow-up; Medication Problem; Patient Education     HISTORY/CURRENT STATUS: HPI  Kamoni Armani presents for follow-up and medication management. Says that she has experienced slightly decreased depression since stopping Vraylar. Her anxiety levels remain hight. She would like to try Buspar again. She is very frustrated and does not want to try anymore medications for depression or moods right now. Says that she does not want to be disappointed.  She will log her moods and anxiety levels for the next few weeks while initiating Vraylar.  Reports her anxiety today at 7/10 and depression at 4/10. She is sleeping 7-8 hours per night.  She has had reduction in SI recently. She feels safe at this time. Strong friend support and she does use her resources when needed. Verbally contacted for safety with this Probation officer. No mania, no psychosis, occasional SI without plan. No HI.   Previous psychiatric medication trials:  Desvenlaxafine Buproprion Luvox Duloxetine Paroxetine Xanax Buspar Klonopin  Viibryd Latuda, Increased anxiety to severe levels Vraylar, increased anxiety      Individual Medical History/ Review of Systems: Changes? :No   Allergies: Patient has no known allergies.  Current Medications:  Current Outpatient Medications:    busPIRone (BUSPAR) 15 MG tablet, Take 1/3 tablet p.o. twice daily for 1 week, then take 2/3 tablet p.o. twice daily for 1 week, then take 1 tablet p.o. twice daily, Disp: 60 tablet, Rfl: 1   lurasidone (LATUDA) 40 MG TABS tablet, Take 1 tablet (40 mg total) by mouth daily with breakfast., Disp: 30 tablet, Rfl: 1   ALPRAZolam (XANAX) 0.25 MG tablet, TAKE 1 TABLET BY MOUTH TWICE A DAY AS NEEDED FOR ANXIETY, Disp: 30 tablet, Rfl: 1   hydroxychloroquine  (PLAQUENIL) 200 MG tablet, Take by mouth., Disp: , Rfl:    meloxicam (MOBIC) 15 MG tablet, Take 1 tablet by mouth daily., Disp: , Rfl:    Multiple Vitamin (MULTIVITAMIN) tablet, Take 1 tablet by mouth daily., Disp: , Rfl:  Medication Side Effects: none  Family Medical/ Social History: Changes? No  MENTAL HEALTH EXAM:  There were no vitals taken for this visit.There is no height or weight on file to calculate BMI.  General Appearance: Casual, Neat, and Well Groomed  Eye Contact:  Good  Speech:  Clear and Coherent  Volume:  Normal  Mood:  Anxious, Depressed, and Dysphoric  Affect:  Depressed, Flat, and Anxious  Thought Process:  Coherent  Orientation:  Full (Time, Place, and Person)  Thought Content: Logical   Suicidal Thoughts:  No  Homicidal Thoughts:  No  Memory:  WNL  Judgement:  Good  Insight:  Good  Psychomotor Activity:  Normal  Concentration:  Concentration: Good  Recall:  Good  Fund of Knowledge: Good  Language: Good  Assets:  Desire for Improvement  ADL's:  Intact  Cognition: WNL  Prognosis:  Good    DIAGNOSES:    ICD-10-CM   1. Generalized anxiety disorder  F41.1 busPIRone (BUSPAR) 15 MG tablet    2. 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 Severe episode of recurrent major depressive disorder, without psychotic features ([place])  F33.2     4. Major depressive disorder, recurrent episode, moderate (HCC)  F33.1     5. Mixed obsessional thoughts and acts  F42.2  Receiving Psychotherapy: No    RECOMMENDATIONS:    She stopped Vraylar 1.5 mg tablet daily at bedtime. Said that it ended up increasing her anxiety. Discussed potential benefits, risks, and side effects of BuSpar.  Patient agrees to trial of BuSpar.  Will start BuSpar 15 mg 1/3 tablet twice daily for 1 week, then increase to 2/3 tablet twice daily for 1 week, then increase to 1 tablet twice daily for anxiety.  Continue Xanax 0.25  twice daily as needed To report  side effects or worsening symptoms Emergency contact information provided Greater than 50% of 30 min face to face time with patient limited improvement with depression but anxiety is always severe.  Been having less intrusive thoughts and suicidal ideation overall. No SI during last month. Continues to struggle with intrusive thoughts and compulsions such as checking locks etc.  She feels safe at this time and has contracted for safety. She has strong friend base for support. Reviewed barriers to medications and fears. Patient is not sexually active and not on BC. Reviewed PDMP      Elwanda Brooklyn, NP

## 2021-12-24 ENCOUNTER — Telehealth: Payer: Self-pay | Admitting: Behavioral Health

## 2021-12-24 NOTE — Telephone Encounter (Signed)
Pt informed

## 2021-12-24 NOTE — Telephone Encounter (Signed)
Have her add third dose. So she can try taking 3 per day, Morning, afternoon, and night. Try this for a week or so and see if anxiety improves. If not we can increase dose one more time after this. We need to try to maximize this medication before we try another.

## 2021-12-24 NOTE — Telephone Encounter (Signed)
Pt advises that she is not feeling any symptom relief on the Buspar.  She has been taking it about 15 days.  Pls call her to advise other options. ? ?Next appt 3/13 ?

## 2021-12-24 NOTE — Telephone Encounter (Signed)
Pt stated she is currently taking 1 tab BID and does not see it helped with anxiety.She reports no side effects other then it making her light headed in the beginning but she is fine now

## 2021-12-30 ENCOUNTER — Other Ambulatory Visit: Payer: Self-pay | Admitting: Behavioral Health

## 2021-12-30 DIAGNOSIS — F411 Generalized anxiety disorder: Secondary | ICD-10-CM

## 2022-01-05 ENCOUNTER — Telehealth: Payer: BC Managed Care – PPO | Admitting: Behavioral Health

## 2022-01-05 ENCOUNTER — Encounter: Payer: Self-pay | Admitting: Behavioral Health

## 2022-01-05 DIAGNOSIS — F422 Mixed obsessional thoughts and acts: Secondary | ICD-10-CM

## 2022-01-05 DIAGNOSIS — F331 Major depressive disorder, recurrent, moderate: Secondary | ICD-10-CM | POA: Diagnosis not present

## 2022-01-05 DIAGNOSIS — F411 Generalized anxiety disorder: Secondary | ICD-10-CM | POA: Diagnosis not present

## 2022-01-05 DIAGNOSIS — F349 Persistent mood [affective] disorder, unspecified: Secondary | ICD-10-CM | POA: Diagnosis not present

## 2022-01-05 DIAGNOSIS — F332 Major depressive disorder, recurrent severe without psychotic features: Secondary | ICD-10-CM

## 2022-01-05 MED ORDER — BUSPIRONE HCL 15 MG PO TABS: 15.0000 mg | ORAL_TABLET | Freq: Three times a day (TID) | ORAL | 1 refills | Status: DC

## 2022-01-05 NOTE — Progress Notes (Signed)
Suzanne Rogers [id number] 08/14/1981 41 y.o.  Virtual Visit via Video Note  I connected with pt @ on 01/05/22 at  1:30 PM EDT by a video enabled telemedicine application and verified that I am speaking with the correct person using two identifiers.   I discussed the limitations of evaluation and management by telemedicine and the availability of in person appointments. The patient expressed understanding and agreed to proceed.  I discussed the assessment and treatment plan with the patient. The patient was provided an opportunity to ask questions and all were answered. The patient agreed with the plan and demonstrated an understanding of the instructions.   The patient was advised to call back or seek an in-person evaluation if the symptoms worsen or if the condition fails to improve as anticipated.  I provided 20 minutes of non-face-to-face time during this encounter.  The patient was located at home.  The provider was located at [facility].   Joan Flores, NP   Subjective:   Patient ID:  Suzanne Rogers is a 41 y.o. (DOB 1981-06-21) female.  Chief Complaint:  Chief Complaint  Patient presents with   Anxiety   Depression   Follow-up   Medication Refill   Medication Problem    HPI Suzanne Rogers presents for follow-up and medication management. After reporting slight decrease in depression after stopping Vraylar she says her depression is back full force. She appears to be somewhat irritable this visit.  She says she feels like her anxiety is more controlled with the Buspar now, she is feeling lack of energy, motivation and unhappiness.  Last visit she was very frustrated and did not want to try any new  medications for depression or moods. Today she is inquiring if there is anything else we can try for depression. Reports her anxiety today at 4/10 and depression at 7/10. She is sleeping 7-8 hours per night.  She has had reduction in SI recently. She feels safe at this time.  Strong friends support and she does use her resources when needed. Verbally contacted for safety with this Clinical research associate. No mania, no psychosis, occasional SI without plan. No HI.   Previous psychiatric medication trials:   Desvenlaxafine Buproprion Luvox Duloxetine Paroxetine Xanax Buspar Klonopin  Viibryd Latuda, Increased anxiety to severe levels Vraylar, increased anxiety       Review of Systems:  Review of Systems  Medications: I have reviewed the patient's current medications.  Current Outpatient Medications  Medication Sig Dispense Refill   lurasidone (LATUDA) 40 MG TABS tablet Take 1 tablet (40 mg total) by mouth daily with breakfast. 30 tablet 1   ALPRAZolam (XANAX) 0.25 MG tablet TAKE 1 TABLET BY MOUTH TWICE A DAY AS NEEDED FOR ANXIETY 30 tablet 1   busPIRone (BUSPAR) 15 MG tablet Take 1 tablet (15 mg total) by mouth 2 (two) times daily. 180 tablet 0   hydroxychloroquine (PLAQUENIL) 200 MG tablet Take by mouth.     meloxicam (MOBIC) 15 MG tablet Take 1 tablet by mouth daily.     Multiple Vitamin (MULTIVITAMIN) tablet Take 1 tablet by mouth daily.     No current facility-administered medications for this visit.    Medication Side Effects: None  Allergies: No Known Allergies  Past Medical History:  Diagnosis Date   Connective tissue disorder (HCC)    Mental disorder    anx/dep    Family History  Problem Relation Age of Onset   Dementia Mother    Alzheimer's disease Mother    Anxiety  disorder Father    Depression Father    Heart attack Father    Anxiety disorder Sister    Depression Sister    Miscarriages / Stillbirths Sister    Endometriosis Sister    Depression Paternal Uncle    Anxiety disorder Paternal Uncle    Heart attack Maternal Grandfather    Diabetes Paternal Grandfather    Cancer Paternal Grandmother        breast    Social History   Socioeconomic History   Marital status: Single    Spouse name: Not on file   Number of children: Not  on file   Years of education: 18   Highest education level: Master's degree (e.g., MA, MS, MEng, MEd, MSW, MBA)  Occupational History   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  Likes to run. Likes to read and learn.    Social Determinants of Health   Financial Resource Strain: Low Risk    Difficulty of Paying Living Expenses: Not hard at all  Food Insecurity: No Food Insecurity   Worried About Programme researcher, broadcasting/film/videounning Out of Food in the Last Year: Never true   Ran Out of Food in the Last Year: Never true  Transportation Needs: No Transportation Needs   Lack of Transportation (Medical): No   Lack of Transportation (Non-Medical): No  Physical Activity: Sufficiently Active   Days of Exercise per Week: 7 days   Minutes of Exercise per Session: 30 min  Stress: Stress Concern Present   Feeling of Stress : Rather much  Social Connections: Moderately Integrated   Frequency of Communication with Friends and Family: Once a week   Frequency of Social Gatherings with Friends and Family: Twice a week   Attends Religious Services: More than 4 times per year   Active Member of [employer] or Organizations: Yes   Attends BankerClub or Organization Meetings: 1 to 4 times per year   Marital Status: Never married  Catering managerntimate Partner Violence: Not At Risk   Fear of Current or Ex-Partner: No   Emotionally Abused: No   Physically Abused: No   Sexually Abused: No    Past Medical History, Surgical history, Social history, and Family history were reviewed and updated as appropriate.   Please see review of systems for further details on the  patient's review from today.   Objective:   Physical Exam:  There were no vitals taken for this visit.  Physical Exam  Lab Review:  No results found for: NA, K, CL, CO2, GLUCOSE, BUN, CREATININE, CALCIUM, PROT, ALBUMIN, AST, ALT, ALKPHOS, BILITOT, GFRNONAA, GFRAA  No results found for: WBC, RBC, HGB, HCT, PLT, MCV, MCH, MCHC, RDW, LYMPHSABS, MONOABS, EOSABS, BASOSABS  No results found for: POCLITH, LITHIUM   No results found for: PHENYTOIN, PHENOBARB, VALPROATE, CBMZ   .res Assessment: Plan:    Suzanne DikeJennifer was seen today for anxiety, depression, follow-up, medication refill and medication problem.  Diagnoses and all orders for this visit:  Major depressive disorder, recurrent episode, moderate (HCC)  Generalized anxiety disorder  Mixed obsessional thoughts and  acts  Persistent mood (affective) disorder, unspecified (HCC)  Severe episode of recurrent major depressive disorder, without psychotic features (HCC)     Continue Buspar 15 mg three times daily Continue Xanax 0.25  twice daily as needed To report side effects or worsening symptoms Emergency contact information provided  Greater than 50% of 20 min video visit with patient was spent on counseling and coordination of care. She has experienced limited improvement with depression over the last 6 months of care. She normally reports having more problems with anxiety but now she is reporting that anxiety has improved with taking the Buspar but depression is severe. Her last SI was yesterday with no plan but reports having this problem for 20 years.  Continues to struggle with intrusive thoughts and compulsions such as checking locks etc. She had to leave work yesterday because of her OCD. Said she could not stop rechecking things.  She feels safe at this time and has contracted for safety. She has strong friend base for support. We had a candid conversation about many of the common and new medication not providing relief. I  expressed my concerns that we have made little to no improvement with all the medications we have tried or that she experiences undesirable side effects and not happy with the medication.  I discussed a trial of lithium with her to try for depression and SI. She does not want to try this right now and there are barriers. She ask if I have better options. I told her that I would consult with Dr. Jennelle Human and see if we could come up with another solution. She agree to f/U later this week.  Reviewed barriers to medications and fears. Patient is not sexually active and not on BC. Reviewed PDMP          Please see After Visit Summary for patient specific instructions.  Future Appointments  Date Time Provider Department Center  03/18/2022  3:30 PM Adline Potter, NP [facility]    No orders of the defined types were placed in this encounter.     -------------------------------

## 2022-02-21 ENCOUNTER — Other Ambulatory Visit: Payer: Self-pay | Admitting: Behavioral Health

## 2022-02-21 DIAGNOSIS — F411 Generalized anxiety disorder: Secondary | ICD-10-CM

## 2022-03-18 ENCOUNTER — Other Ambulatory Visit: Payer: BC Managed Care – PPO | Admitting: Adult Health

## 2022-03-18 ENCOUNTER — Other Ambulatory Visit: Payer: Self-pay | Admitting: Behavioral Health

## 2022-03-18 DIAGNOSIS — F411 Generalized anxiety disorder: Secondary | ICD-10-CM

## 2022-04-07 ENCOUNTER — Other Ambulatory Visit: Payer: Self-pay | Admitting: Obstetrics & Gynecology

## 2022-04-07 DIAGNOSIS — Z1231 Encounter for screening mammogram for malignant neoplasm of breast: Secondary | ICD-10-CM

## 2022-04-08 ENCOUNTER — Encounter: Payer: Self-pay | Admitting: Obstetrics & Gynecology

## 2022-04-08 ENCOUNTER — Ambulatory Visit (INDEPENDENT_AMBULATORY_CARE_PROVIDER_SITE_OTHER): Payer: BC Managed Care – PPO | Admitting: Obstetrics & Gynecology

## 2022-04-08 VITALS — BP 103/73 | HR 80 | Ht 67.0 in | Wt 119.0 lb

## 2022-04-08 DIAGNOSIS — Z01419 Encounter for gynecological examination (general) (routine) without abnormal findings: Secondary | ICD-10-CM | POA: Diagnosis not present

## 2022-04-08 NOTE — Progress Notes (Signed)
   WELL-WOMAN EXAMINATION Patient name: Suzanne Rogers MRN [id number]  Date of birth: 1981/04/28 Chief Complaint:   Gynecologic Exam and pap/physcial  History of Present Illness:   Suzanne Rogers is a 41 y.o. G0P0000  female being seen today for a routine well-woman exam.  Today she notes no acute complaints or concerns  Not sexually active, did not discuss contraceptive management at this time.  Last pap 02/2021 negative.  Last mammogram: 03/2021 and scheduled for this June. Last colonoscopy: n/a     04/08/2022    3:19 PM 02/26/2021    9:35 AM 12/14/2017    3:36 PM 12/08/2016    3:33 PM  Depression screen PHQ 2/9  Decreased Interest 3 1 1 2   Down, Depressed, Hopeless 3 2 1 2   PHQ - 2 Score 6 3 2 4   Altered sleeping 3 1 0 0  Tired, decreased energy 1 1 1 1   Change in appetite 0 0 0   Feeling bad or failure about yourself  3 1 0 2  Trouble concentrating 3 2 0 1  Moving slowly or fidgety/restless 0 0 0 0  Suicidal thoughts 1 1 0 1  PHQ-9 Score 17 9 3 9   Difficult doing work/chores    Somewhat difficult      Review of Systems:   Pertinent items are noted in HPI Denies any headaches, blurred vision, fatigue, shortness of breath, chest pain, abdominal pain, bowel movements, urination, or intercourse unless otherwise stated above.  Pertinent History Reviewed:  Reviewed past medical,surgical, social and family history.  Reviewed problem list, medications and allergies. Physical Assessment:   Vitals:   04/08/22 1512  BP: 103/73  Pulse: 80  Weight: 119 lb (54 kg)  Height: 5\' 7"  (1.702 m)  Body mass index is 18.64 kg/m.        Physical Examination:   General appearance - well appearing, and in no distress  Mental status - alert, oriented to person, place, and time  Psych:  She has a normal mood and affect  Skin - warm and dry, normal color, no suspicious lesions noted  Chest - effort normal, all lung fields clear to auscultation bilaterally  Heart - normal rate and regular  rhythm  Neck:  midline trachea, no thyromegaly or nodules  Breasts - breasts appear normal, no suspicious masses, no skin or nipple changes or  axillary nodes  Abdomen - soft, nontender, nondistended, no masses or organomegaly  Pelvic - VULVA: normal appearing vulva with no masses, tenderness or lesions  VAGINA: normal appearing vagina with normal color and discharge, no lesions  CERVIX: normal appearing cervix without discharge or lesions, no CMT  UTERUS: uterus is felt to be normal size, shape, consistency and nontender   ADNEXA: No adnexal masses or tenderness noted.  Extremities:  No swelling or varicosities noted  Chaperone:     Assessment & Plan:  1) Well-Woman Exam -reviewed screening guidelines -uptodate   Meds: No orders of the defined types were placed in this encounter.   Follow-up: Return in about 1 year (around 04/09/2023) for Annual.   , DO Attending Obstetrician & Gynecologist, Faculty Practice Center for [facility], [facility]

## 2022-04-24 ENCOUNTER — Ambulatory Visit
Admission: RE | Admit: 2022-04-24 | Discharge: 2022-04-24 | Disposition: A | Discharge status: Home / Self Care | Payer: BC Managed Care – PPO | Source: Ambulatory Visit

## 2022-04-24 DIAGNOSIS — Z1231 Encounter for screening mammogram for malignant neoplasm of breast: Secondary | ICD-10-CM

## 2022-05-20 ENCOUNTER — Other Ambulatory Visit: Payer: Self-pay | Admitting: Behavioral Health

## 2022-05-20 DIAGNOSIS — F411 Generalized anxiety disorder: Secondary | ICD-10-CM

## 2023-04-22 ENCOUNTER — Other Ambulatory Visit: Payer: Self-pay | Admitting: Physician Assistant

## 2023-04-22 DIAGNOSIS — Z1231 Encounter for screening mammogram for malignant neoplasm of breast: Secondary | ICD-10-CM

## 2023-05-05 ENCOUNTER — Ambulatory Visit
Admission: RE | Admit: 2023-05-05 | Discharge: 2023-05-05 | Disposition: A | Discharge status: Home / Self Care | Payer: BC Managed Care – PPO | Source: Ambulatory Visit

## 2023-05-05 DIAGNOSIS — Z1231 Encounter for screening mammogram for malignant neoplasm of breast: Secondary | ICD-10-CM

## 2024-04-25 ENCOUNTER — Other Ambulatory Visit: Payer: Self-pay | Admitting: Physician Assistant

## 2024-04-25 DIAGNOSIS — Z1231 Encounter for screening mammogram for malignant neoplasm of breast: Secondary | ICD-10-CM

## 2024-05-09 ENCOUNTER — Inpatient Hospital Stay
Admission: RE | Admit: 2024-05-09 | Discharge: 2024-05-09 | Discharge status: Home / Self Care | Payer: Self-pay | Source: Ambulatory Visit | Attending: Physician Assistant | Admitting: Physician Assistant

## 2024-05-09 DIAGNOSIS — Z1231 Encounter for screening mammogram for malignant neoplasm of breast: Secondary | ICD-10-CM

## 2024-05-15 ENCOUNTER — Other Ambulatory Visit: Payer: Self-pay | Admitting: Physician Assistant

## 2024-05-15 DIAGNOSIS — R928 Other abnormal and inconclusive findings on diagnostic imaging of breast: Secondary | ICD-10-CM

## 2024-05-23 ENCOUNTER — Ambulatory Visit
Admission: RE | Admit: 2024-05-23 | Discharge: 2024-05-23 | Disposition: A | Discharge status: Home / Self Care | Source: Ambulatory Visit | Attending: Physician Assistant | Admitting: Physician Assistant

## 2024-05-23 DIAGNOSIS — R928 Other abnormal and inconclusive findings on diagnostic imaging of breast: Secondary | ICD-10-CM
